# Patient Record
Sex: Male | Born: 1965 | Marital: Married | Smoking: Never smoker
Health system: Southern US, Community
[De-identification: ages and names within clinical notes are randomized; demographics above are authoritative.]

## PROBLEM LIST (undated history)

## (undated) DIAGNOSIS — I10 Essential (primary) hypertension: Secondary | ICD-10-CM

## (undated) DIAGNOSIS — L501 Idiopathic urticaria: Secondary | ICD-10-CM

## (undated) DIAGNOSIS — L309 Dermatitis, unspecified: Secondary | ICD-10-CM

## (undated) DIAGNOSIS — E7800 Pure hypercholesterolemia, unspecified: Secondary | ICD-10-CM

## (undated) DIAGNOSIS — T7840XA Allergy, unspecified, initial encounter: Secondary | ICD-10-CM

## (undated) HISTORY — DX: Idiopathic urticaria: L50.1

## (undated) HISTORY — PX: OTHER SURGICAL HISTORY: SHX169

## (undated) HISTORY — DX: Dermatitis, unspecified: L30.9

## (undated) HISTORY — DX: Allergy, unspecified, initial encounter: T78.40XA

---

## 2010-12-22 ENCOUNTER — Encounter (INDEPENDENT_AMBULATORY_CARE_PROVIDER_SITE_OTHER): Payer: BC Managed Care – PPO | Admitting: Family Medicine

## 2010-12-22 DIAGNOSIS — Z23 Encounter for immunization: Secondary | ICD-10-CM

## 2010-12-22 DIAGNOSIS — Z0000 Encounter for general adult medical examination without abnormal findings: Secondary | ICD-10-CM

## 2010-12-22 DIAGNOSIS — J019 Acute sinusitis, unspecified: Secondary | ICD-10-CM

## 2010-12-22 DIAGNOSIS — I10 Essential (primary) hypertension: Secondary | ICD-10-CM

## 2010-12-22 DIAGNOSIS — E782 Mixed hyperlipidemia: Secondary | ICD-10-CM

## 2011-04-10 ENCOUNTER — Emergency Department (HOSPITAL_COMMUNITY)
Admission: EM | Admit: 2011-04-10 | Discharge: 2011-04-10 | Disposition: A | Discharge status: Home / Self Care | Payer: BC Managed Care – PPO | Attending: Emergency Medicine | Admitting: Emergency Medicine

## 2011-04-10 ENCOUNTER — Encounter (HOSPITAL_COMMUNITY): Payer: Self-pay | Admitting: Emergency Medicine

## 2011-04-10 DIAGNOSIS — R109 Unspecified abdominal pain: Secondary | ICD-10-CM | POA: Insufficient documentation

## 2011-04-10 DIAGNOSIS — K529 Noninfective gastroenteritis and colitis, unspecified: Secondary | ICD-10-CM

## 2011-04-10 DIAGNOSIS — Z7982 Long term (current) use of aspirin: Secondary | ICD-10-CM | POA: Insufficient documentation

## 2011-04-10 DIAGNOSIS — E7800 Pure hypercholesterolemia, unspecified: Secondary | ICD-10-CM | POA: Insufficient documentation

## 2011-04-10 DIAGNOSIS — Z79899 Other long term (current) drug therapy: Secondary | ICD-10-CM | POA: Insufficient documentation

## 2011-04-10 DIAGNOSIS — I10 Essential (primary) hypertension: Secondary | ICD-10-CM | POA: Insufficient documentation

## 2011-04-10 HISTORY — DX: Essential (primary) hypertension: I10

## 2011-04-10 HISTORY — DX: Pure hypercholesterolemia, unspecified: E78.00

## 2011-04-10 MED ORDER — ONDANSETRON HCL 8 MG PO TABS: 8.0000 mg | ORAL_TABLET | ORAL | Status: AC | PRN

## 2011-04-10 MED ORDER — DIPHENOXYLATE-ATROPINE 2.5-0.025 MG PO TABS: 2.0000 | ORAL_TABLET | Freq: Four times a day (QID) | ORAL | Status: AC | PRN

## 2011-04-10 NOTE — ED Notes (Signed)
Pt presenting to ed with c/o abdominal pain with nausea, vomiting and diarrhea. Pt states he had just finished eating lunch. Pt is alert and oriented at this time.

## 2011-04-10 NOTE — Discharge Instructions (Signed)
Diet for Diarrhea, Adult Having frequent, runny stools (diarrhea) has many causes. Diarrhea may be caused or worsened by food or drink. Diarrhea may be relieved by changing your diet. IF YOU ARE NOT TOLERATING SOLID FOODS:  Drink enough water and fluids to keep your urine clear or pale yellow.   Avoid sugary drinks and sodas as well as milk-based beverages.   Avoid beverages containing caffeine and alcohol.   You may try rehydrating beverages. You can make your own by following this recipe:    tsp table salt.    tsp baking soda.   ? tsp salt substitute (potassium chloride).   1 tbs + 1 tsp sugar.   1 qt water.  As your stools become more solid, you can start eating solid foods. Add foods one at a time. If a certain food causes your diarrhea to get worse, avoid that food and try other foods. A low fiber, low-fat, and lactose-free diet is recommended. Small, frequent meals may be better tolerated.  Starches  Allowed:  White, French, and pita breads, plain rolls, buns, bagels. Plain muffins, matzo. Soda, saltine, or graham crackers. Pretzels, melba toast, zwieback. Cooked cereals made with water: cornmeal, farina, cream cereals. Dry cereals: refined corn, wheat, rice. Potatoes prepared any way without skins, refined macaroni, spaghetti, noodles, refined rice.   Avoid:  Bread, rolls, or crackers made with whole wheat, multi-grains, rye, bran seeds, nuts, or coconut. Corn tortillas or taco shells. Cereals containing whole grains, multi-grains, bran, coconut, nuts, or raisins. Cooked or dry oatmeal. Coarse wheat cereals, granola. Cereals advertised as "high-fiber." Potato skins. Whole grain pasta, wild or brown rice. Popcorn. Sweet potatoes/yams. Sweet rolls, doughnuts, waffles, pancakes, sweet breads.  Vegetables  Allowed: Strained tomato and vegetable juices. Most well-cooked and canned vegetables without seeds. Fresh: Tender lettuce, cucumber without the skin, cabbage, spinach, bean  sprouts.   Avoid: Fresh, cooked, or canned: Artichokes, baked beans, beet greens, broccoli, Brussels sprouts, corn, kale, legumes, peas, sweet potatoes. Cooked: Green or red cabbage, spinach. Avoid large servings of any vegetables, because vegetables shrink when cooked, and they contain more fiber per serving than fresh vegetables.  Fruit  Allowed: All fruit juices except prune juice. Cooked or canned: Apricots, applesauce, cantaloupe, cherries, fruit cocktail, grapefruit, grapes, kiwi, mandarin oranges, peaches, pears, plums, watermelon. Fresh: Apples without skin, ripe banana, grapes, cantaloupe, cherries, grapefruit, peaches, oranges, plums. Keep servings limited to  cup or 1 piece.   Avoid: Fresh: Apple with skin, apricots, mango, pears, raspberries, strawberries. Prune juice, stewed or dried prunes. Dried fruits, raisins, dates. Large servings of all fresh fruits.  Meat and Meat Substitutes  Allowed: Ground or well-cooked tender beef, ham, veal, lamb, pork, or poultry. Eggs, plain cheese. Fish, oysters, shrimp, lobster, other seafoods. Liver, organ meats.   Avoid: Tough, fibrous meats with gristle. Peanut butter, smooth or chunky. Cheese, nuts, seeds, legumes, dried peas, beans, lentils.  Milk  Allowed: Yogurt, lactose-free milk, kefir, drinkable yogurt, buttermilk, soy milk.   Avoid: Milk, chocolate milk, beverages made with milk, such as milk shakes.  Soups  Allowed: Bouillon, broth, or soups made from allowed foods. Any strained soup.   Avoid: Soups made from vegetables that are not allowed, cream or milk-based soups.  Desserts and Sweets  Allowed: Sugar-free gelatin, sugar-free frozen ice pops made without sugar alcohol.   Avoid: Plain cakes and cookies, pie made with allowed fruit, pudding, custard, cream pie. Gelatin, fruit, ice, sherbet, frozen ice pops. Ice cream, ice milk without nuts. Plain hard candy,   honey, jelly, molasses, syrup, sugar, chocolate syrup, gumdrops,  marshmallows.  Fats and Oils  Allowed: Avoid any fats and oils.   Avoid: Seeds, nuts, olives, avocados. Margarine, butter, cream, mayonnaise, salad oils, plain salad dressings made from allowed foods. Plain gravy, crisp bacon without rind.  Beverages  Allowed: Water, decaffeinated teas, oral rehydration solutions, sugar-free beverages.   Avoid: Fruit juices, caffeinated beverages (coffee, tea, soda or pop), alcohol, sports drinks, or lemon-lime soda or pop.  Condiments  Allowed: Ketchup, mustard, horseradish, vinegar, cream sauce, cheese sauce, cocoa powder. Spices in moderation: allspice, basil, bay leaves, celery powder or leaves, cinnamon, cumin powder, curry powder, ginger, mace, marjoram, onion or garlic powder, oregano, paprika, parsley flakes, ground pepper, rosemary, sage, savory, tarragon, thyme, turmeric.   Avoid: Coconut, honey.  Weight Monitoring: Weigh yourself every day. You should weigh yourself in the morning after you urinate and before you eat breakfast. Wear the same amount of clothing when you weigh yourself. Record your weight daily. Bring your recorded weights to your clinic visits. Tell your caregiver right away if you have gained 3 lb/1.4 kg or more in 1 day, 5 lb/2.3 kg in a week, or whatever amount you were told to report. SEEK IMMEDIATE MEDICAL CARE IF:   You are unable to keep fluids down.   You start to throw up (vomit) or diarrhea keeps coming back (persistent).   Abdominal pain develops, increases, or can be felt in one place (localizes).   You have an oral temperature above 102 F (38.9 C), not controlled by medicine.   Diarrhea contains blood or mucus.   You develop excessive weakness, dizziness, fainting, or extreme thirst.  MAKE SURE YOU:   Understand these instructions.   Will watch your condition.   Will get help right away if you are not doing well or get worse.  Document Released: 03/11/2003 Document Revised: 12/08/2010 Document Reviewed:  07/02/2008 [facility] Patient Information 2012 [place], [place].    Clear liquids for next several hours.  Medications for nausea and diarrhea.  Rest.

## 2011-04-10 NOTE — ED Provider Notes (Signed)
History     CSN: [id number]  Arrival date & time 04/10/11  1412   First MD Initiated Contact with Patient 04/10/11 1516      Chief Complaint  Patient presents with  . Abdominal Pain  . Diarrhea    (Consider location/radiation/quality/duration/timing/severity/associated sxs/prior treatment) HPI... patient is a sixth-grade Engineer, site. Location today he had abrupt onset of abdominal cramping, diarrhea, clamminess, diaphoresis.  He felt better initially and then had a second episode of same. Presently he is feeling totally normal. No exposure to ill students for family members. Symptoms have been approximately 2 hours ago scribe with mod  Past Medical History  Diagnosis Date  . Hypertension   . High cholesterol     Past Surgical History  Procedure Date  . Kidney stones     No family history on file.  History  Substance Use Topics  . Smoking status: Never Smoker   . Smokeless tobacco: Not on file  . Alcohol Use: No      Review of Systems  All other systems reviewed and are negative.    Allergies  Codeine  Home Medications   Current Outpatient Rx  Name Route Sig Dispense Refill  . ASPIRIN EC 81 MG PO TBEC Oral Take 81 mg by mouth daily.    Marland Kitchen CETIRIZINE HCL 10 MG PO TABS Oral Take 10 mg by mouth daily.    . IBUPROFEN 200 MG PO TABS Oral Take 400 mg by mouth every 6 (six) hours as needed. Pain    . LISINOPRIL 10 MG PO TABS Oral Take 10 mg by mouth daily.    . ADULT MULTIVITAMIN W/MINERALS CH Oral Take 1 tablet by mouth daily.    Marland Kitchen ROSUVASTATIN CALCIUM 10 MG PO TABS Oral Take 10 mg by mouth daily.    Marland Kitchen VITAMIN C 500 MG PO TABS Oral Take 500 mg by mouth daily.      BP 131/70  Pulse 99  Temp 97.5 F (36.4 C)  Resp 20  SpO2 94%  Physical Exam  Nursing note and vitals reviewed. Constitutional: He is oriented to person, place, and time. He appears well-developed and well-nourished.  HENT:  Head: Normocephalic and atraumatic.  Eyes: Conjunctivae and EOM  are normal. Pupils are equal, round, and reactive to light.  Neck: Normal range of motion. Neck supple.  Cardiovascular: Normal rate and regular rhythm.   Pulmonary/Chest: Effort normal and breath sounds normal.  Abdominal: Soft. Bowel sounds are normal.  Musculoskeletal: Normal range of motion.  Neurological: He is alert and oriented to person, place, and time.  Skin: Skin is warm and dry.  Psychiatric: He has a normal mood and affect.    ED Course  Procedures (including critical care time)  Labs Reviewed - No data to display No results found.   No diagnosis found.    MDM  Patient feeling much better now. He is ambulatory with normal vital signs. No testing necessary. We'll discharge home with Lomotil and Zofran.  Push fluids        Donnetta Hutching, MD 04/10/11 1556

## 2011-06-20 ENCOUNTER — Other Ambulatory Visit: Payer: Self-pay | Admitting: Family Medicine

## 2011-10-21 ENCOUNTER — Other Ambulatory Visit: Payer: Self-pay | Admitting: Family Medicine

## 2011-10-21 NOTE — Telephone Encounter (Signed)
Please pull paper chart.  

## 2011-10-21 NOTE — Telephone Encounter (Signed)
Chart pulled to PA pool at nurses station [phone]

## 2012-01-06 ENCOUNTER — Other Ambulatory Visit: Payer: Self-pay | Admitting: Family Medicine

## 2012-01-06 ENCOUNTER — Ambulatory Visit (INDEPENDENT_AMBULATORY_CARE_PROVIDER_SITE_OTHER): Payer: BC Managed Care – PPO | Admitting: Family Medicine

## 2012-01-06 VITALS — BP 141/87 | HR 80 | Temp 98.0°F | Resp 20 | Ht 69.75 in | Wt 243.0 lb

## 2012-01-06 DIAGNOSIS — E7800 Pure hypercholesterolemia, unspecified: Secondary | ICD-10-CM | POA: Insufficient documentation

## 2012-01-06 DIAGNOSIS — I10 Essential (primary) hypertension: Secondary | ICD-10-CM

## 2012-01-06 DIAGNOSIS — Z23 Encounter for immunization: Secondary | ICD-10-CM

## 2012-01-06 LAB — POCT CBC
Granulocyte percent: 58.8 %G (ref 37–80)
Hemoglobin: 16.6 g/dL (ref 14.1–18.1)
MID (cbc): 0.4 (ref 0–0.9)
MPV: 8 fL (ref 0–99.8)
POC MID %: 7.5 %M (ref 0–12)
Platelet Count, POC: 288 10*3/uL (ref 142–424)
RBC: 5.47 M/uL (ref 4.69–6.13)

## 2012-01-06 LAB — COMPREHENSIVE METABOLIC PANEL
ALT: 36 U/L (ref 0–53)
AST: 27 U/L (ref 0–37)
Albumin: 4.5 g/dL (ref 3.5–5.2)
Calcium: 9.9 mg/dL (ref 8.4–10.5)
Chloride: 103 mEq/L (ref 96–112)
Potassium: 3.9 mEq/L (ref 3.5–5.3)
Sodium: 141 mEq/L (ref 135–145)
Total Protein: 6.8 g/dL (ref 6.0–8.3)

## 2012-01-06 LAB — LIPID PANEL: Total CHOL/HDL Ratio: 4.1 Ratio

## 2012-01-06 MED ORDER — LISINOPRIL 10 MG PO TABS: 10.0000 mg | ORAL_TABLET | Freq: Every day | ORAL | Status: DC

## 2012-01-06 MED ORDER — ROSUVASTATIN CALCIUM 10 MG PO TABS: 10.0000 mg | ORAL_TABLET | Freq: Every day | ORAL | Status: DC

## 2012-01-06 NOTE — Patient Instructions (Addendum)
Please check your BP every now and then- if it is persistently running higher than 140/ 85 please let me know.  Work on losing a few pounds by cutting back on portions a little bit and getting more exercise.  I will be in touch with your labs in the next few days

## 2012-01-06 NOTE — Progress Notes (Signed)
[facility] [address] [place] [address] [phone]  Date:  01/06/2012   Name:  Kurt Rodgers   DOB:  12-Oct-1965   MRN:  [id number]  PCP:  No primary provider on file.    Chief Complaint: Medication Refill   History of Present Illness:  Kurt Rodgers is a 47 y.o. very pleasant male patient who presents with the following:  He is here today for a medication refill.  He has not eaten yet today- he is due for labs- his last labs were about one year ago. He will occasionally check his BP- it may be approx 130s over 80s.  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  He is aware that he has put on some weight over the last few years and is not exercising much.      There is no problem list on file for this patient.   Past Medical History  Diagnosis Date  . Hypertension   . High cholesterol     Past Surgical History  Procedure Date  . Kidney stones     History  Substance Use Topics  . Smoking status: Never Smoker   . Smokeless tobacco: Not on file  . Alcohol Use: No    Family History  Problem Relation Age of Onset  . Cancer Father   . Stroke Father     Allergies  Allergen Reactions  . Codeine Hives    Medication list has been reviewed and updated.  Current Outpatient Prescriptions on File Prior to Visit  Medication Sig Dispense Refill  . aspirin EC 81 MG tablet Take 81 mg by mouth daily.      . CRESTOR 10 MG tablet TAKE 1 TABLET BY MOUTH EVERY EVENING    AFTER A MEAL  30 tablet  1  . ibuprofen (ADVIL,MOTRIN) 200 MG tablet Take 400 mg by mouth every 6 (six) hours as needed. Pain      . lisinopril (PRINIVIL,ZESTRIL) 10 MG tablet Take 10 mg by mouth daily.      . Multiple Vitamin (MULITIVITAMIN WITH MINERALS) TABS Take 1 tablet by mouth daily.      . vitamin C (ASCORBIC ACID) 500 MG tablet Take 500 mg by mouth daily.      . cetirizine (ZYRTEC) 10 MG tablet Take 10 mg by mouth daily.        Review of Systems:  As per HPI-  otherwise negative.  Physical Examination: Filed Vitals:   01/06/12 1405  BP: 141/87  Pulse: 80  Temp: 98 F (36.7 C)  Resp: 20   Filed Vitals:   01/06/12 1405  Height: 5' 9.75" (1.772 m)  Weight: 243 lb (110.224 kg)   Body mass index is 35.12 kg/(m^2). Ideal Body Weight: Weight in (lb) to have BMI = 25: 172.6   GEN: WDWN, NAD, Non-toxic, A & O x 3, overweight HEENT: Atraumatic, Normocephalic. Neck supple. No masses, No LAD. Ears and Nose: No external deformity. CV: RRR, No M/G/R. No JVD. No thrill. No extra heart sounds. PULM: CTA B, no wheezes, crackles, rhonchi. No retractions. No resp. distress. No accessory muscle use. ABD: S, NT, ND EXTR: No c/c/e NEURO Normal gait.  PSYCH: Normally interactive. Conversant. Not depressed or anxious appearing.  Calm demeanor.    Assessment and Plan: 1. HTN (hypertension)  lisinopril (PRINIVIL,ZESTRIL) 10 MG tablet  2. High cholesterol  rosuvastatin (CRESTOR) 10 MG tablet, POCT CBC, Comprehensive metabolic panel, Lipid panel   Refilled medications  as above, labs pending.  Encouraged weight loss and exercise.    Abbe Amsterdam, MD

## 2012-01-09 ENCOUNTER — Encounter: Payer: Self-pay | Admitting: Family Medicine

## 2012-01-09 LAB — BILIRUBIN, FRACTIONATED(TOT/DIR/INDIR)
Bilirubin, Direct: 0.3 mg/dL (ref 0.0–0.3)
Indirect Bilirubin: 1.8 mg/dL — ABNORMAL HIGH (ref 0.0–0.9)
Total Bilirubin: 2.1 mg/dL — ABNORMAL HIGH (ref 0.3–1.2)

## 2012-01-11 ENCOUNTER — Encounter: Payer: Self-pay | Admitting: Family Medicine

## 2013-01-09 ENCOUNTER — Other Ambulatory Visit: Payer: Self-pay | Admitting: Family Medicine

## 2013-01-11 ENCOUNTER — Encounter: Payer: Self-pay | Admitting: Family Medicine

## 2013-02-01 ENCOUNTER — Ambulatory Visit (INDEPENDENT_AMBULATORY_CARE_PROVIDER_SITE_OTHER): Payer: BC Managed Care – PPO | Admitting: Internal Medicine

## 2013-02-01 VITALS — BP 126/72 | HR 74 | Temp 97.5°F | Resp 18 | Ht 70.0 in | Wt 245.0 lb

## 2013-02-01 DIAGNOSIS — Z823 Family history of stroke: Secondary | ICD-10-CM

## 2013-02-01 DIAGNOSIS — I10 Essential (primary) hypertension: Secondary | ICD-10-CM

## 2013-02-01 DIAGNOSIS — E785 Hyperlipidemia, unspecified: Secondary | ICD-10-CM

## 2013-02-01 DIAGNOSIS — Z807 Family history of other malignant neoplasms of lymphoid, hematopoietic and related tissues: Secondary | ICD-10-CM

## 2013-02-01 DIAGNOSIS — R21 Rash and other nonspecific skin eruption: Secondary | ICD-10-CM

## 2013-02-01 LAB — POCT CBC
GRANULOCYTE PERCENT: 60.3 % (ref 37–80)
HCT, POC: 47.1 % (ref 43.5–53.7)
Hemoglobin: 15.1 g/dL (ref 14.1–18.1)
Lymph, poc: 1.6 (ref 0.6–3.4)
MCH: 30.6 pg (ref 27–31.2)
MCHC: 32.1 g/dL (ref 31.8–35.4)
MCV: 95.4 fL (ref 80–97)
MID (CBC): 0.3 (ref 0–0.9)
MPV: 8.2 fL (ref 0–99.8)
PLATELET COUNT, POC: 229 10*3/uL (ref 142–424)
POC Granulocyte: 2.9 (ref 2–6.9)
POC LYMPH %: 32.8 % (ref 10–50)
POC MID %: 6.9 % (ref 0–12)
RBC: 4.94 M/uL (ref 4.69–6.13)
RDW, POC: 14.4 %
WBC: 4.8 10*3/uL (ref 4.6–10.2)

## 2013-02-01 LAB — LIPID PANEL
CHOL/HDL RATIO: 4 ratio
CHOLESTEROL: 157 mg/dL (ref 0–200)
HDL: 39 mg/dL — ABNORMAL LOW (ref >39)
LDL CALC: 75 mg/dL (ref 0–99)
Triglycerides: 214 mg/dL — ABNORMAL HIGH (ref <150)
VLDL: 43 mg/dL — AB (ref 0–40)

## 2013-02-01 LAB — POCT URINALYSIS DIPSTICK
Bilirubin, UA: NEGATIVE
Blood, UA: NEGATIVE
GLUCOSE UA: NEGATIVE
Ketones, UA: NEGATIVE
LEUKOCYTES UA: NEGATIVE
NITRITE UA: NEGATIVE
PROTEIN UA: NEGATIVE
SPEC GRAV UA: 1.02
UROBILINOGEN UA: 0.2
pH, UA: 5.5

## 2013-02-01 LAB — POCT UA - MICROSCOPIC ONLY
BACTERIA, U MICROSCOPIC: NEGATIVE
CASTS, UR, LPF, POC: NEGATIVE
CRYSTALS, UR, HPF, POC: NEGATIVE
Mucus, UA: NEGATIVE
Yeast, UA: NEGATIVE

## 2013-02-01 LAB — COMPREHENSIVE METABOLIC PANEL
ALBUMIN: 4.5 g/dL (ref 3.5–5.2)
ALK PHOS: 49 U/L (ref 39–117)
ALT: 36 U/L (ref 0–53)
AST: 27 U/L (ref 0–37)
BILIRUBIN TOTAL: 2.1 mg/dL — AB (ref 0.2–1.2)
BUN: 13 mg/dL (ref 6–23)
CO2: 22 mEq/L (ref 19–32)
Calcium: 9.2 mg/dL (ref 8.4–10.5)
Chloride: 105 mEq/L (ref 96–112)
Creat: 0.84 mg/dL (ref 0.50–1.35)
Glucose, Bld: 84 mg/dL (ref 70–99)
POTASSIUM: 4.1 meq/L (ref 3.5–5.3)
SODIUM: 140 meq/L (ref 135–145)
TOTAL PROTEIN: 6.7 g/dL (ref 6.0–8.3)

## 2013-02-01 MED ORDER — ROSUVASTATIN CALCIUM 10 MG PO TABS: 10.0000 mg | ORAL_TABLET | Freq: Every day | ORAL | Status: DC

## 2013-02-01 MED ORDER — LISINOPRIL 10 MG PO TABS: 10.0000 mg | ORAL_TABLET | Freq: Every day | ORAL | Status: DC

## 2013-02-01 MED ORDER — TRIAMCINOLONE 0.1 % CREAM:EUCERIN CREAM 1:1: 1.0000 "application " | TOPICAL_CREAM | Freq: Two times a day (BID) | CUTANEOUS | Status: DC

## 2013-02-01 NOTE — Progress Notes (Signed)
   Subjective:    Patient ID: Kurt Rodgers, male    DOB: 07/17/1965, 48 y.o.   MRN: [id number]  HPI Needs meds rfed for HTN and hyperlipidemia. Also has a chronic skin rash  On ankles left more than right. No problems with meds. Last cpe over 1 year ago.   Review of Systems No problems    Objective:   Physical Exam  Constitutional: He is oriented to person, place, and time. He appears well-developed and well-nourished.  HENT:  Head: Normocephalic.  Eyes: EOM are normal. No scleral icterus.  Neck: Normal range of motion.  Cardiovascular: Normal rate, regular rhythm and normal heart sounds.   Pulmonary/Chest: Effort normal and breath sounds normal.  Musculoskeletal: Normal range of motion.  Neurological: He is alert and oriented to person, place, and time. He exhibits normal muscle tone. Coordination normal.  Skin: Skin is dry and intact. Rash noted. No abrasion, no ecchymosis and no lesion noted. Rash is macular. There is erythema.     Psychiatric: He has a normal mood and affect. His behavior is normal. Thought content normal.   Results for orders placed in visit on 02/01/13  POCT CBC      Result Value Range   WBC 4.8  4.6 - 10.2 K/uL   Lymph, poc 1.6  0.6 - 3.4   POC LYMPH PERCENT 32.8  10 - 50 %L   MID (cbc) 0.3  0 - 0.9   POC MID % 6.9  0 - 12 %M   POC Granulocyte 2.9  2 - 6.9   Granulocyte percent 60.3  37 - 80 %G   RBC 4.94  4.69 - 6.13 M/uL   Hemoglobin 15.1  14.1 - 18.1 g/dL   HCT, POC 78.247.1  95.643.5 - 53.7 %   MCV 95.4  80 - 97 fL   MCH, POC 30.6  27 - 31.2 pg   MCHC 32.1  31.8 - 35.4 g/dL   RDW, POC 21.314.4     Platelet Count, POC 229  142 - 424 K/uL   MPV 8.2  0 - 99.8 fL  POCT UA - MICROSCOPIC ONLY      Result Value Range   WBC, Ur, HPF, POC 0-1     RBC, urine, microscopic 0-1     Bacteria, U Microscopic NEG     Mucus, UA NEG     Epithelial cells, urine per micros 0-1     Crystals, Ur, HPF, POC NEG     Casts, Ur, LPF, POC NEG     Yeast, UA NEG    POCT  URINALYSIS DIPSTICK      Result Value Range   Color, UA YELLOW     Clarity, UA CLEAR     Glucose, UA NEG     Bilirubin, UA NEG     Ketones, UA NEG     Spec Grav, UA 1.020     Blood, UA NEG     pH, UA 5.5     Protein, UA NEG     Urobilinogen, UA 0.2     Nitrite, UA NEG     Leukocytes, UA Negative            Assessment & Plan:  HTN/Lipids/Stasis dermatitis Support stockings/Cream

## 2013-02-01 NOTE — Patient Instructions (Signed)
Venous Stasis or Chronic Venous Insufficiency Chronic venous insufficiency, also called venous stasis, is a condition that affects the veins in the legs. The condition prevents blood from being pumped through these veins effectively. Blood may no longer be pumped effectively from the legs back to the heart. This condition can range from mild to severe. With proper treatment, you should be able to continue with an active life. CAUSES  Chronic venous insufficiency occurs when the vein walls become stretched, weakened, or damaged or when valves within the vein are damaged. Some common causes of this include:  High blood pressure inside the veins (venous hypertension).  Increased blood pressure in the leg veins from long periods of sitting or standing.  A blood clot that blocks blood flow in a vein (deep vein thrombosis).  Inflammation of a superficial vein (phlebitis) that causes a blood clot to form. RISK FACTORS Various things can make you more likely to develop chronic venous insufficiency, including:  Family history of this condition.  Obesity.  Pregnancy.  Sedentary lifestyle.  Smoking.  Jobs requiring long periods of standing or sitting in one place.  Being a certain age. Women in their 40s and 50s and men in their 70s are more likely to develop this condition. SIGNS AND SYMPTOMS  Symptoms may include:   Varicose veins.  Skin breakdown or ulcers.  Reddened or discolored skin on the leg.  Brown, smooth, tight, and painful skin just above the ankle, usually on the inside surface (lipodermatosclerosis).  Swelling. DIAGNOSIS  To diagnose this condition, your health care provider will take a medical history and do a physical exam. The following tests may be ordered to confirm the diagnosis:  Duplex ultrasound A procedure that produces a picture of a blood vessel and nearby organs and also provides information on blood flow through the blood vessel.  Plethysmography A  procedure that tests blood flow.  A venogram, or venography A procedure used to look at the veins using X-ray and dye. TREATMENT The goals of treatment are to help you return to an active life and to minimize pain or disability. Treatment will depend on the severity of the condition. Medical procedures may be needed for severe cases. Treatment options may include:   Use of compression stockings. These can help with symptoms and lower the chances of the problem getting worse, but they do not cure the problem.  Sclerotherapy A procedure involving an injection of a material that "dissolves" the damaged veins. Other veins in the network of blood vessels take over the function of the damaged veins.  Surgery to remove the vein or cut off blood flow through the vein (vein stripping or laser ablation surgery).  Surgery to repair a valve. HOME CARE INSTRUCTIONS   Wear compression stockings as directed by your health care provider.  Only take over-the-counter or prescription medicines for pain, discomfort, or fever as directed by your health care provider.  Follow up with your health care provider as directed. SEEK MEDICAL CARE IF:   You have redness, swelling, or increasing pain in the affected area.  You see a red streak or line that extends up or down from the affected area.  You have a breakdown or loss of skin in the affected area, even if the breakdown is small.  You have an injury to the affected area. SEEK IMMEDIATE MEDICAL CARE IF:   You have an injury and open wound in the affected area.  Your pain is severe and does not improve with   medicine.  You have sudden numbness or weakness in the foot or ankle below the affected area, or you have trouble moving your foot or ankle.  You have a fever or persistent symptoms for more than 2 3 days.  You have a fever and your symptoms suddenly get worse. MAKE SURE YOU:   Understand these instructions.  Will watch your condition.  Will  get help right away if you are not doing well or get worse. Document Released: 04/24/2006 Document Revised: 10/09/2012 Document Reviewed: 08/26/2012 [employer] Patient Information 2014 [employer].  

## 2013-02-02 LAB — TSH: TSH: 0.781 u[IU]/mL (ref 0.350–4.500)

## 2013-02-02 LAB — PSA: PSA: 1.32 ng/mL (ref <=4.00)

## 2013-08-01 ENCOUNTER — Ambulatory Visit (INDEPENDENT_AMBULATORY_CARE_PROVIDER_SITE_OTHER): Payer: BC Managed Care – PPO | Admitting: Family Medicine

## 2013-08-01 ENCOUNTER — Encounter: Payer: Self-pay | Admitting: Family Medicine

## 2013-08-01 VITALS — BP 104/76 | HR 87 | Temp 97.9°F | Resp 16 | Ht 69.75 in | Wt 243.4 lb

## 2013-08-01 DIAGNOSIS — E7800 Pure hypercholesterolemia, unspecified: Secondary | ICD-10-CM

## 2013-08-01 DIAGNOSIS — E669 Obesity, unspecified: Secondary | ICD-10-CM

## 2013-08-01 DIAGNOSIS — I10 Essential (primary) hypertension: Secondary | ICD-10-CM

## 2013-08-01 LAB — COMPLETE METABOLIC PANEL WITH GFR
ALK PHOS: 55 U/L (ref 39–117)
ALT: 21 U/L (ref 0–53)
AST: 18 U/L (ref 0–37)
Albumin: 4.4 g/dL (ref 3.5–5.2)
BILIRUBIN TOTAL: 2.3 mg/dL — AB (ref 0.2–1.2)
BUN: 14 mg/dL (ref 6–23)
CO2: 23 mEq/L (ref 19–32)
Calcium: 9.1 mg/dL (ref 8.4–10.5)
Chloride: 102 mEq/L (ref 96–112)
Creat: 0.86 mg/dL (ref 0.50–1.35)
GFR, Est African American: >89 mL/min
GLUCOSE: 75 mg/dL (ref 70–99)
Potassium: 4.2 mEq/L (ref 3.5–5.3)
SODIUM: 138 meq/L (ref 135–145)
TOTAL PROTEIN: 6.9 g/dL (ref 6.0–8.3)

## 2013-08-01 LAB — THYROID PANEL WITH TSH
Free Thyroxine Index: 3.7 (ref 1.0–3.9)
T3 UPTAKE: 36.9 % (ref 22.5–37.0)
T4 TOTAL: 10.1 ug/dL (ref 5.0–12.5)
TSH: 1.53 u[IU]/mL (ref 0.350–4.500)

## 2013-08-01 LAB — LIPID PANEL
CHOL/HDL RATIO: 3.5 ratio
CHOLESTEROL: 157 mg/dL (ref 0–200)
HDL: 45 mg/dL (ref >39)
LDL CALC: 86 mg/dL (ref 0–99)
Triglycerides: 132 mg/dL (ref <150)
VLDL: 26 mg/dL (ref 0–40)

## 2013-08-01 NOTE — Patient Instructions (Signed)
Hypertension- you can try taking 1/2 Lisinopril tablet daily and check your BP; if the top number stays below 140 and bottom number stays below 90, continue this dose. If the number goes above 140/90, then resume whole 10 mg tablet daily.  Below is some information about "heart healthy" nutrition:      Mediterranean Diet  Why follow it? Research shows.   Those who follow the Mediterranean diet have a reduced risk of heart disease    The diet is associated with a reduced incidence of Parkinson's and Alzheimer's diseases   People following the diet may have longer life expectancies and lower rates of chronic diseases    The Dietary Guidelines for Americans recommends the Mediterranean diet as an eating plan to promote health and prevent disease  What Is the Mediterranean Diet?    Healthy eating plan based on typical foods and recipes of Mediterranean-style cooking   The diet is primarily a plant based diet; these foods should make up a majority of meals   Starches - Plant based foods should make up a majority of meals - They are an important sources of vitamins, minerals, energy, antioxidants, and fiber - Choose whole grains, foods high in fiber and minimally processed items  - Typical grain sources include wheat, oats, barley, corn, brown rice, bulgar, farro, millet, polenta, couscous  - Various types of beans include chickpeas, lentils, fava beans, black beans, white beans   Fruits  Veggies - Large quantities of antioxidant rich fruits & veggies; 6 or more servings  - Vegetables can be eaten raw or lightly drizzled with oil and cooked  - Vegetables common to the traditional Mediterranean Diet include: artichokes, arugula, beets, broccoli, brussel sprouts, cabbage, carrots, celery, collard greens, cucumbers, eggplant, kale, leeks, lemons, lettuce, mushrooms, okra, onions, peas, peppers, potatoes, pumpkin, radishes, rutabaga, shallots, spinach, sweet potatoes, turnips, zucchini - Fruits common  to the Mediterranean Diet include: apples, apricots, avocados, cherries, clementines, dates, figs, grapefruits, grapes, melons, nectarines, oranges, peaches, pears, pomegranates, strawberries, tangerines  Fats - Replace butter and margarine with healthy oils, such as olive oil, canola oil, and tahini  - Limit nuts to no more than a handful a day  - Nuts include walnuts, almonds, pecans, pistachios, pine nuts  - Limit or avoid candied, honey roasted or heavily salted nuts - Olives are central to the [employer]iterranean diet - can be eaten whole or used in a variety of dishes   Meats Protein - Limiting red meat: no more than a few times a month - When eating red meat: choose lean cuts and keep the portion to the size of deck of cards - Eggs: approx. 0 to 4 times a week  - Fish and lean poultry: at least 2 a week  - Healthy protein sources include, chicken, [place], lean beef, lamb - Increase intake of seafood such as tuna, salmon, trout, mackerel, shrimp, scallops - Avoid or limit high fat processed meats such as sausage and bacon  Dairy - Include moderate amounts of low fat dairy products  - Focus on healthy dairy such as fat free yogurt, skim milk, low or reduced fat cheese - Limit dairy products higher in fat such as whole or 2% milk, cheese, ice cream  Alcohol - Moderate amounts of red wine is ok  - No more than 5 oz daily for women (all ages) and men older than age 46  - No more than 10 oz of wine daily for men younger than 7265  Other -  Limit sweets and other desserts  - Use herbs and spices instead of salt to flavor foods  - Herbs and spices common to the traditional Mediterranean Diet include: basil, bay leaves, chives, cloves, cumin, fennel, garlic, lavender, marjoram, mint, oregano, parsley, pepper, rosemary, sage, savory, sumac, tarragon, thyme   It's not just a diet, it's a lifestyle:    The Mediterranean diet includes lifestyle factors typical of those in the region    Foods, drinks and  meals are best eaten with others and savored   Daily physical activity is important for overall good health   This could be strenuous exercise like running and aerobics   This could also be more leisurely activities such as walking, housework, yard-work, or taking the stairs   Moderation is the key; a balanced and healthy diet accommodates most foods and drinks   Consider portion sizes and frequency of consumption of certain foods   Meal Ideas & Options:    Breakfast:  o Whole wheat toast or whole wheat English muffins with peanut butter & hard boiled egg o Steel cut oats topped with apples & cinnamon and skim milk  o Fresh fruit: banana, strawberries, melon, berries, peaches  o Smoothies: strawberries, bananas, greek yogurt, peanut butter o Low fat greek yogurt with blueberries and granola  o Egg white omelet with spinach and mushrooms o Breakfast couscous: whole wheat couscous, apricots, skim milk, cranberries    Sandwiches:  o Hummus and grilled vegetables (peppers, zucchini, squash) on whole wheat bread   o Grilled chicken on whole wheat pita with lettuce, tomatoes, cucumbers or tzatziki  o Tuna salad on whole wheat bread: tuna salad made with greek yogurt, olives, red peppers, capers, green onions o Garlic rosemary lamb pita: lamb sauted with garlic, rosemary, salt & pepper; add lettuce, cucumber, greek yogurt to pita - flavor with lemon juice and black pepper    Seafood:  o Mediterranean grilled salmon, seasoned with garlic, basil, parsley, lemon juice and black pepper o Shrimp, lemon, and spinach whole-grain pasta salad made with low fat greek yogurt  o Seared scallops with lemon orzo  o Seared tuna steaks seasoned salt, pepper, coriander topped with tomato mixture of olives, tomatoes, olive oil, minced garlic, parsley, green onions and cappers    Meats:  o Herbed greek chicken salad with kalamata olives, cucumber, feta  o Red bell peppers stuffed with spinach, bulgur, lean ground  beef (or lentils) & topped with feta   o Kebabs: skewers of chicken, tomatoes, onions, zucchini, squash  o [place] burgers: made with red onions, mint, dill, lemon juice, feta cheese topped with roasted red peppers   Vegetarian o Cucumber salad: cucumbers, artichoke hearts, celery, red onion, feta cheese, tossed in olive oil & lemon juice  o Hummus and whole grain pita points with a greek salad (lettuce, tomato, feta, olives, cucumbers, red onion) o Lentil soup with celery, carrots made with vegetable broth, garlic, salt and pepper  o Tabouli salad: parsley, bulgur, mint, scallions, cucumbers, tomato, radishes, lemon juice, olive oil, salt and pepper. o

## 2013-08-01 NOTE — Progress Notes (Signed)
S:  This 48 y.o. Cauc male has well controlled HTN; BP readings at home 110-130/ 70-85. Today's reading is lowest ever; pt asymptomatic. He is compliant w/ medication w/o adverse reactions. Pt denies fatigue, diaphoresis, vision disturbances, CP or tightness, palpitations, edema, SOB or cough, HA, dizziness, numbness, weakness or syncope. No abd pain, GI upset, back pain or myalgias related to statin.  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 In his free time, he enjoys reading and quite activities at home.  Patient Active Problem List   Diagnosis Date Noted  . HTN (hypertension) 01/06/2012  . High cholesterol 01/06/2012   Outpatient Encounter Prescriptions as of 08/01/2013  Medication Sig Note  . aspirin EC 81 MG tablet Take 81 mg by mouth daily.   . cetirizine (ZYRTEC) 10 MG tablet Take 10 mg by mouth daily.   Marland Kitchen. ibuprofen (ADVIL,MOTRIN) 200 MG tablet Take 400 mg by mouth every 6 (six) hours as needed. Pain   . lisinopril (PRINIVIL,ZESTRIL) 10 MG tablet Take 1 tablet (10 mg total) by mouth daily.   . Multiple Vitamin (MULITIVITAMIN WITH MINERALS) TABS Take 1 tablet by mouth daily.   . rosuvastatin (CRESTOR) 10 MG tablet Take 1 tablet (10 mg total) by mouth daily.   . Triamcinolone Acetonide (TRIAMCINOLONE 0.1 % CREAM : EUCERIN) CREA Apply 1 application topically 2 (two) times daily.   . vitamin C (ASCORBIC ACID) 500 MG tablet Take 500 mg by mouth daily.     Allergies  Allergen Reactions  . Codeine Hives   History   Social History  . Marital Status: Married    Spouse Name: N/A    Number of Children: N/A  . Years of Education: N/A   Occupational History  . Teacher   Social History Main Topics  . Smoking status: Never Smoker   . Smokeless tobacco: Not on file  . Alcohol Use: No  . Drug Use: No  . Sexual Activity: Not on file    O: Filed Vitals:   08/01/13 0801  BP: 104/76  Pulse: 87  Temp: 97.9 F (36.6 C)  Resp: 16   GEN: In NAD: WN,WD. HENT: [place]/AT;  EOMI w/ clear conj/sclerae. Ext ears/ nose/oroph unremarkable. COR: RRR.  LUNGS: Normal resp effort. SKIN: W&D; intact w/o diaphoresis, erythema or pallor. MS: MAEs; no deformities or muscle atrophy. NEURO: A&Ox 3; CNs intact. Nonfocal.  A/P: Essential hypertension - Plan: Thyroid Panel With TSH, COMPLETE METABOLIC PANEL WITH GFR  High cholesterol - Stable on Crestor; no dose change.  Plan: Thyroid Panel With TSH, Lipid panel  Obesity, unspecified - Encouraged more active lifestyle. Plan: Vitamin D, 25-hydroxy

## 2013-08-02 LAB — VITAMIN D 25 HYDROXY (VIT D DEFICIENCY, FRACTURES): Vit D, 25-Hydroxy: 45 ng/mL (ref 30–89)

## 2013-08-03 ENCOUNTER — Other Ambulatory Visit: Payer: Self-pay | Admitting: Family Medicine

## 2013-08-03 DIAGNOSIS — R17 Unspecified jaundice: Secondary | ICD-10-CM

## 2013-08-03 NOTE — Progress Notes (Signed)
Quick Note:  Please advise pt regarding following labs... All your labs are normal except the Total bilirubin remains above normal. I would like to get an ultrasound of your liver and gallbladder to see if there is any abnormality to explain this persistent elevation. 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 I will order the test and a staff person will call you with the details of that appointment. If you do not want to have the ultrasound, let the staff person know and they will cancel it.  Copy to pt.  ______

## 2013-08-06 ENCOUNTER — Encounter: Payer: Self-pay | Admitting: Radiology

## 2013-08-07 ENCOUNTER — Ambulatory Visit
Admission: RE | Admit: 2013-08-07 | Discharge: 2013-08-07 | Disposition: A | Discharge status: Home / Self Care | Payer: BC Managed Care – PPO | Source: Ambulatory Visit | Attending: Family Medicine | Admitting: Family Medicine

## 2013-08-07 DIAGNOSIS — R17 Unspecified jaundice: Secondary | ICD-10-CM

## 2013-08-07 NOTE — Progress Notes (Signed)
Quick Note:  Your recent imaging study (ultrasound of liver and gallbladder) shows fatty infiltration of the liver. This is seen with overweight persons who have increased abdominal wall fatty tissue.  No other problems identified. ______

## 2013-12-24 ENCOUNTER — Ambulatory Visit (INDEPENDENT_AMBULATORY_CARE_PROVIDER_SITE_OTHER): Payer: BC Managed Care – PPO | Admitting: Family Medicine

## 2013-12-24 ENCOUNTER — Encounter: Payer: Self-pay | Admitting: Family Medicine

## 2013-12-24 VITALS — BP 138/86 | HR 98 | Temp 97.9°F | Resp 16 | Ht 70.0 in | Wt 243.8 lb

## 2013-12-24 DIAGNOSIS — K760 Fatty (change of) liver, not elsewhere classified: Secondary | ICD-10-CM

## 2013-12-24 DIAGNOSIS — E669 Obesity, unspecified: Secondary | ICD-10-CM

## 2013-12-24 DIAGNOSIS — I10 Essential (primary) hypertension: Secondary | ICD-10-CM

## 2013-12-24 DIAGNOSIS — Z23 Encounter for immunization: Secondary | ICD-10-CM

## 2013-12-24 DIAGNOSIS — M7502 Adhesive capsulitis of left shoulder: Secondary | ICD-10-CM

## 2013-12-24 DIAGNOSIS — Z5181 Encounter for therapeutic drug level monitoring: Secondary | ICD-10-CM

## 2013-12-24 DIAGNOSIS — E785 Hyperlipidemia, unspecified: Secondary | ICD-10-CM

## 2013-12-24 DIAGNOSIS — Z0000 Encounter for general adult medical examination without abnormal findings: Secondary | ICD-10-CM

## 2013-12-24 LAB — BASIC METABOLIC PANEL
BUN: 14 mg/dL (ref 6–23)
CHLORIDE: 104 meq/L (ref 96–112)
CO2: 24 meq/L (ref 19–32)
CREATININE: 0.84 mg/dL (ref 0.50–1.35)
Calcium: 9.2 mg/dL (ref 8.4–10.5)
GLUCOSE: 88 mg/dL (ref 70–99)
Potassium: 4.3 mEq/L (ref 3.5–5.3)
SODIUM: 140 meq/L (ref 135–145)

## 2013-12-24 MED ORDER — ROSUVASTATIN CALCIUM 10 MG PO TABS: 10.0000 mg | ORAL_TABLET | Freq: Every day | ORAL | Status: DC

## 2013-12-24 MED ORDER — LISINOPRIL 10 MG PO TABS: 10.0000 mg | ORAL_TABLET | Freq: Every day | ORAL | Status: DC

## 2013-12-24 NOTE — Progress Notes (Signed)
Subjective:    Patient ID: Kurt Rodgers, male    DOB: 01/17/1965, 48 y.o.   MRN: [id number]  PCP: No primary care provider on file.  Chief Complaint  Patient presents with  . Annual Exam   Patient Active Problem List   Diagnosis Date Noted  . HTN (hypertension) 01/06/2012  . High cholesterol 01/06/2012   Prior to Admission medications   Medication Sig Start Date End Date Taking? Authorizing Provider  aspirin EC 81 MG tablet Take 81 mg by mouth daily.   Yes Historical Provider, MD  cetirizine (ZYRTEC) 10 MG tablet Take 10 mg by mouth daily.   Yes Historical Provider, MD  ibuprofen (ADVIL,MOTRIN) 200 MG tablet Take 400 mg by mouth every 6 (six) hours as needed. Pain   Yes Historical Provider, MD  lisinopril (PRINIVIL,ZESTRIL) 10 MG tablet Take 1 tablet (10 mg total) by mouth daily. 02/01/13  Yes Jonita Albeehris W Guest, MD  Multiple Vitamin (MULITIVITAMIN WITH MINERALS) TABS Take 1 tablet by mouth daily.   Yes Historical Provider, MD  rosuvastatin (CRESTOR) 10 MG tablet Take 1 tablet (10 mg total) by mouth daily. 02/01/13  Yes Jonita Albeehris W Guest, MD  Triamcinolone Acetonide (TRIAMCINOLONE 0.1 % CREAM : EUCERIN) CREA Apply 1 application topically 2 (two) times daily. 02/01/13  Yes Jonita Albeehris W Guest, MD  vitamin C (ASCORBIC ACID) 500 MG tablet Take 500 mg by mouth daily.   Yes Historical Provider, MD   Medications, allergies, past medical history, surgical history, family history, social history and problem list reviewed and updated.  HPI  [age] yom with pmh htn, high cholesterol presents for cpe.  Has been doing well since last visit in July. At that visit his bp was well controlled on 10 mg lisinopril. It was noted he could cut dose in 1/2 if he wanted. Today he states he has not really been checking his bp at home other than when he is not feeling well. It runs 130s/80s at home and is in that range today. He denies any cp, sob, vision changes, ha, dizziness, syncope.   Has had number of labs drawn within  past year which have all been wnl other than bili which has been elevated. RUQ US done 8/15 showed hepatic steatosis.   Has hx adhesive capsulitis left shoulder, went through PT over the summer and has been much improved. Mentions he has normal ROM and no real pain throughout the day other than when he lies on the shoulder at night.   He had uri couple wks ago which has resolved.   Exercise: Not doing any. He does pt/stretching for left shoulder but otherwise no exercise or aerobic activity. Diet: Not really watching his diet. Drinks occasional sugary drinks.   Review of Systems No chest pain, SOB, HA, dizziness, vision change, N/V, diarrhea, constipation, dysuria, urinary urgency or frequency, myalgias, arthralgias or rash.    Objective:   Physical Exam  Constitutional: He is oriented to person, place, and time. He appears well-developed and well-nourished.  Non-toxic appearance. He does not have a sickly appearance. He does not appear ill. No distress.  BP 138/86 mmHg  Pulse 98  Temp(Src) 97.9 F (36.6 C) (Oral)  Resp 16  Ht 5\' 10"  (1.778 m)  Wt 243 lb 12.8 oz (110.587 kg)  BMI 34.98 kg/m2  SpO2 93%   HENT:  Right Ear: Tympanic membrane normal.  Left Ear: Tympanic membrane normal.  Nose: Nose normal.  Mouth/Throat: Uvula is midline, oropharynx is clear and moist and mucous  membranes are normal. No oropharyngeal exudate, posterior oropharyngeal edema, posterior oropharyngeal erythema or tonsillar abscesses.  Eyes: Conjunctivae and EOM are normal. Pupils are equal, round, and reactive to light.  Neck: Normal range of motion.  Cardiovascular: Normal rate, regular rhythm, S1 normal, S2 normal and normal heart sounds.  Exam reveals no gallop.   No murmur heard. Pulses:      Dorsalis pedis pulses are 2+ on the right side, and 2+ on the left side.  Pulmonary/Chest: Effort normal and breath sounds normal. He has no decreased breath sounds. He has no wheezes. He has no rhonchi. He has no  rales.  Abdominal: Soft. Normal appearance and bowel sounds are normal. There is no hepatomegaly. There is no tenderness. There is negative Murphy's sign.  Musculoskeletal:       Left shoulder: Normal. He exhibits normal range of motion, no tenderness, no bony tenderness and no crepitus.  Neurological: He is alert and oriented to person, place, and time. He has normal strength. No cranial nerve deficit or sensory deficit.  Psychiatric: He has a normal mood and affect. His speech is normal.      Assessment & Plan:   [age] yom with pmh htn, high cholesterol presents for cpe.  Annual physical exam --normal vitals and exam today, no monitoring labs due today as all have been drawn within prev year --rtc one year --pt declines std testing  Need for Tdap vaccination - Plan: Tdap vaccine greater than or equal to 7yo IM  Encounter for therapeutic drug monitoring - Plan: Basic metabolic panel --lisinopril monitoring  Essential hypertension - Plan: lisinopril (PRINIVIL,ZESTRIL) 10 MG tablet --130s/80s range at home and in clinic today --refill lisinopril  Adhesive capsulitis, left --PT this past summer, much improved --nearly full rom, only painful if lies on it at night --continue exercises at home  Hepatic steatosis Hyperlipidemia - Plan: rosuvastatin (CRESTOR) 10 MG tablet --lipids normal 7/15 --refill crestor --encourage wt loss, exercise, limiting fat/sweets intake  Donnajean Lopesodd M. Mohsin Crum, PA-C Physician Assistant-Certified [facility]  12/24/2013 9:04 AM

## 2013-12-24 NOTE — Progress Notes (Signed)
   Subjective:    Patient ID: Kurt Rodgers, male    DOB: 06/04/1965, 48 y.o.   MRN: [id number]  HPI  This 48 y.o. Cauc male is here for CPE. He has well controlled HTN, lipid disorder and obesity. No lifestyle changes for weight loss.  See documentation provided by Raelyn Ensignodd McVeigh, PA-C  Patient Active Problem List   Diagnosis Date Noted  . Obesity 12/24/2013  . HTN (hypertension) 01/06/2012  . High cholesterol 01/06/2012    Prior to Admission medications   Medication Sig Start Date End Date Taking? Authorizing Provider  aspirin EC 81 MG tablet Take 81 mg by mouth daily.   Yes Historical Provider, MD  cetirizine (ZYRTEC) 10 MG tablet Take 10 mg by mouth daily.   Yes Historical Provider, MD  ibuprofen (ADVIL,MOTRIN) 200 MG tablet Take 400 mg by mouth every 6 (six) hours as needed. Pain   Yes Historical Provider, MD  lisinopril (PRINIVIL,ZESTRIL) 10 MG tablet Take 1 tablet (10 mg total) by mouth daily.   Yes   Multiple Vitamin (MULITIVITAMIN WITH MINERALS) TABS Take 1 tablet by mouth daily.   Yes Historical Provider, MD  rosuvastatin (CRESTOR) 10 MG tablet Take 1 tablet (10 mg total) by mouth daily.   Yes   Triamcinolone Acetonide (TRIAMCINOLONE 0.1 % CREAM : EUCERIN) CREA Apply 1 application topically 2 (two) times daily.   Yes Jonita Albeehris W Guest, MD  vitamin C (ASCORBIC ACID) 500 MG tablet Take 500 mg by mouth daily.   Yes Historical Provider, MD    PMHx, SURG Hx, SOC and FAM Hx reviewed.   Review of Systems  Constitutional: Negative.   HENT: Negative.   Eyes: Negative.   Respiratory: Positive for cough.   Cardiovascular: Negative.   Gastrointestinal: Negative.   Endocrine: Negative.   Genitourinary: Negative.   Musculoskeletal: Positive for arthralgias.  Skin: Negative.   Allergic/Immunologic: Negative.   Neurological: Negative.   Hematological: Negative.   Psychiatric/Behavioral: Negative.       Objective:   Physical Exam See documentation by McVeigh, PA-C- nothing to  add.     Assessment & Plan:  Annual physical exam  Encounter for therapeutic drug monitoring - Plan: Basic metabolic panel  Essential hypertension - Plan: lisinopril (PRINIVIL,ZESTRIL) 10 MG tablet  Adhesive capsulitis, left  Hyperlipidemia - Plan: rosuvastatin (CRESTOR) 10 MG tablet  Hepatic steatosis- Related to obesity.  Obesity- Encouraged regular exercise for improving physical fitness and preventing cognitive decline.  Need for Tdap vaccination - Plan: Tdap vaccine greater than or equal to 7yo IM   I have reviewed and agree with documentation per Raelyn Ensignodd McVeigh, PA-C.   Maurice MarchBarbara B. Ludie Hudon, MD [facility]

## 2013-12-24 NOTE — Patient Instructions (Signed)
Keeping you healthy  Get these tests  Blood pressure- Have your blood pressure checked once a year by your healthcare provider.  Normal blood pressure is 120/80.  Weight- Have your body mass index (BMI) calculated to screen for obesity.  BMI is a measure of body fat based on height and weight. You can also calculate your own BMI at www.nhlbisupport.com/bmi/.  Cholesterol- Have your cholesterol checked regularly starting at age 46, sooner may be necessary if you have diabetes, high blood pressure, if a family member developed heart diseases at an early age or if you smoke.   Chlamydia, HIV, and other sexual transmitted disease- Get screened each year until the age of 25 then within three months of each new sexual partner.  Diabetes- Have your blood sugar checked regularly if you have high blood pressure, high cholesterol, a family history of diabetes or if you are overweight.  Get these vaccines  Flu shot- Every fall.  Tetanus shot- Every 10 years.  Menactra- Single dose; prevents meningitis.  Take these steps  Don't smoke- If you do smoke, ask your healthcare provider about quitting. For tips on how to quit, go to www.smokefree.gov or call 1-800-QUIT-NOW.  Be physically active- Exercise 5 days a week for at least 30 minutes.  If you are not already physically active start slow and gradually work up to 30 minutes of moderate physical activity.  Examples of moderate activity include walking briskly, mowing the yard, dancing, swimming bicycling, etc.  Eat a healthy diet- Eat a variety of healthy foods such as fruits, vegetables, low fat milk, low fat cheese, yogurt, lean meats, poultry, fish, beans, tofu, etc.  For more information on healthy eating, go to www.thenutritionsource.org  Drink alcohol in moderation- Limit alcohol intake two drinks or less a day.  Never drink and drive.  Dentist- Brush and floss teeth twice daily; visit your dentis twice a year.  Depression-Your emotional  health is as important as your physical health.  If you're feeling down, losing interest in things you normally enjoy please talk with your healthcare provider.  Gun Safety- If you keep a gun in your home, keep it unloaded and with the safety lock on.  Bullets should be stored separately.  Helmet use- Always wear a helmet when riding a motorcycle, bicycle, rollerblading or skateboarding.  Safe sex- If you may be exposed to a sexually transmitted infection, use a condom  Seat belts- Seat bels can save your life; always wear one.  Smoke/Carbon Monoxide detectors- These detectors need to be installed on the appropriate level of your home.  Replace batteries at least once a year.  Skin Cancer- When out in the sun, cover up and use sunscreen SPF 15 or higher.  Violence- If anyone is threatening or hurting you, please tell your healthcare provider.    DASH Eating Plan DASH stands for "Dietary Approaches to Stop Hypertension." The DASH eating plan is a healthy eating plan that has been shown to reduce high blood pressure (hypertension). Additional health benefits may include reducing the risk of type 2 diabetes mellitus, heart disease, and stroke. The DASH eating plan may also help with weight loss. WHAT DO I NEED TO KNOW ABOUT THE DASH EATING PLAN? For the DASH eating plan, you will follow these general guidelines:  Choose foods with a percent daily value for sodium of less than 5% (as listed on the food label).  Use salt-free seasonings or herbs instead of table salt or sea salt.  Check with your health   care provider or pharmacist before using salt substitutes.  Eat lower-sodium products, often labeled as "lower sodium" or "no salt added."  Eat fresh foods.  Eat more vegetables, fruits, and low-fat dairy products.  Choose whole grains. Look for the word "whole" as the first word in the ingredient list.  Choose fish and skinless chicken or turkey more often than red meat. Limit fish,  poultry, and meat to 6 oz (170 g) each day.  Limit sweets, desserts, sugars, and sugary drinks.  Choose heart-healthy fats.  Limit cheese to 1 oz (28 g) per day.  Eat more home-cooked food and less restaurant, buffet, and fast food.  Limit fried foods.  Cook foods using methods other than frying.  Limit canned vegetables. If you do use them, rinse them well to decrease the sodium.  When eating at a restaurant, ask that your food be prepared with less salt, or no salt if possible. WHAT FOODS CAN I EAT? Seek help from a dietitian for individual calorie needs. Grains Whole grain or whole wheat bread. Brown rice. Whole grain or whole wheat pasta. Quinoa, bulgur, and whole grain cereals. Low-sodium cereals. Corn or whole wheat flour tortillas. Whole grain cornbread. Whole grain crackers. Low-sodium crackers. Vegetables Fresh or frozen vegetables (raw, steamed, roasted, or grilled). Low-sodium or reduced-sodium tomato and vegetable juices. Low-sodium or reduced-sodium tomato sauce and paste. Low-sodium or reduced-sodium canned vegetables.  Fruits All fresh, canned (in natural juice), or frozen fruits. Meat and Other Protein Products Ground beef (85% or leaner), grass-fed beef, or beef trimmed of fat. Skinless chicken or turkey. Ground chicken or turkey. Pork trimmed of fat. All fish and seafood. Eggs. Dried beans, peas, or lentils. Unsalted nuts and seeds. Unsalted canned beans. Dairy Low-fat dairy products, such as skim or 1% milk, 2% or reduced-fat cheeses, low-fat ricotta or cottage cheese, or plain low-fat yogurt. Low-sodium or reduced-sodium cheeses. Fats and Oils Tub margarines without trans fats. Light or reduced-fat mayonnaise and salad dressings (reduced sodium). Avocado. Safflower, olive, or canola oils. Natural peanut or almond butter. Other Unsalted popcorn and pretzels. The items listed above may not be a complete list of recommended foods or beverages. Contact your dietitian  for more options. WHAT FOODS ARE NOT RECOMMENDED? Grains White bread. White pasta. White rice. Refined cornbread. Bagels and croissants. Crackers that contain trans fat. Vegetables Creamed or fried vegetables. Vegetables in a cheese sauce. Regular canned vegetables. Regular canned tomato sauce and paste. Regular tomato and vegetable juices. Fruits Dried fruits. Canned fruit in light or heavy syrup. Fruit juice. Meat and Other Protein Products Fatty cuts of meat. Ribs, chicken wings, bacon, sausage, bologna, salami, chitterlings, fatback, hot dogs, bratwurst, and packaged luncheon meats. Salted nuts and seeds. Canned beans with salt. Dairy Whole or 2% milk, cream, half-and-half, and cream cheese. Whole-fat or sweetened yogurt. Full-fat cheeses or blue cheese. Nondairy creamers and whipped toppings. Processed cheese, cheese spreads, or cheese curds. Condiments Onion and garlic salt, seasoned salt, table salt, and sea salt. Canned and packaged gravies. Worcestershire sauce. Tartar sauce. Barbecue sauce. Teriyaki sauce. Soy sauce, including reduced sodium. Steak sauce. Fish sauce. Oyster sauce. Cocktail sauce. Horseradish. Ketchup and mustard. Meat flavorings and tenderizers. Bouillon cubes. Hot sauce. Tabasco sauce. Marinades. Taco seasonings. Relishes. Fats and Oils Butter, stick margarine, lard, shortening, ghee, and bacon fat. Coconut, palm kernel, or palm oils. Regular salad dressings. Other Pickles and olives. Salted popcorn and pretzels. The items listed above may not be a complete list of foods and beverages to avoid.   Contact your dietitian for more information. WHERE CAN I FIND MORE INFORMATION? National Heart, Lung, and Blood Institute: www.nhlbi.nih.gov/health/health-topics/topics/dash/ Document Released: 12/08/2010 Document Revised: 05/05/2013 Document Reviewed: 10/23/2012 ExitCare Patient Information 2015 [employer]. This information is not intended to replace advice given to you  by your health care provider. Make sure you discuss any questions you have with your health care provider.  

## 2014-05-02 ENCOUNTER — Telehealth: Payer: Self-pay | Admitting: Family Medicine

## 2014-05-02 NOTE — Telephone Encounter (Signed)
lmom to reschedule his appt with another provider he had a ov with Mcpherson but she will be retired at that time

## 2014-06-24 ENCOUNTER — Ambulatory Visit: Payer: BC Managed Care – PPO | Admitting: Family Medicine

## 2015-02-17 IMAGING — US US ABDOMEN LIMITED
1 series · 14 of 25 positions shown · non-contrast
Comparison: None.

CLINICAL DATA: Elevated bilirubin.

EXAM:
US ABDOMEN LIMITED - RIGHT UPPER QUADRANT

[Series 1: us abdomen limited · 0.30mm/px · 14 of 31 slices shown]
[im 1/31]
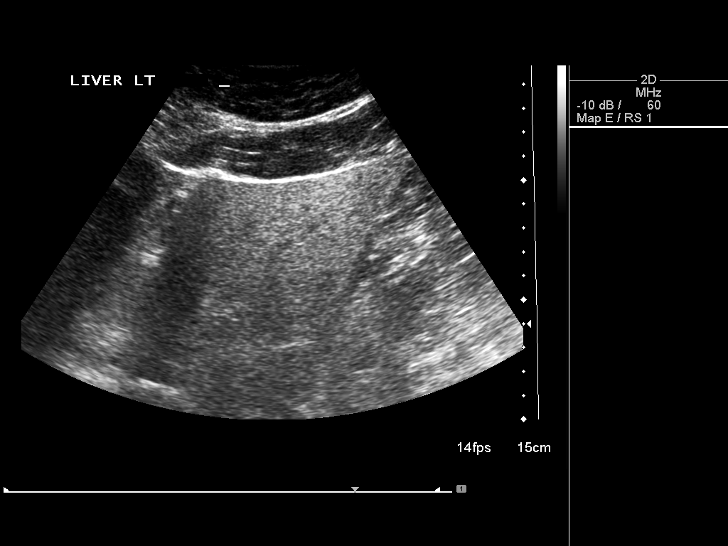
[im 3/31]
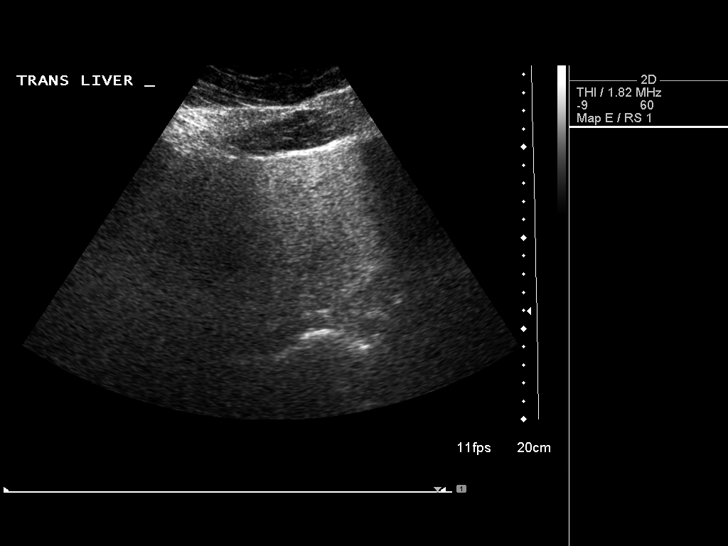
[im 6/31]
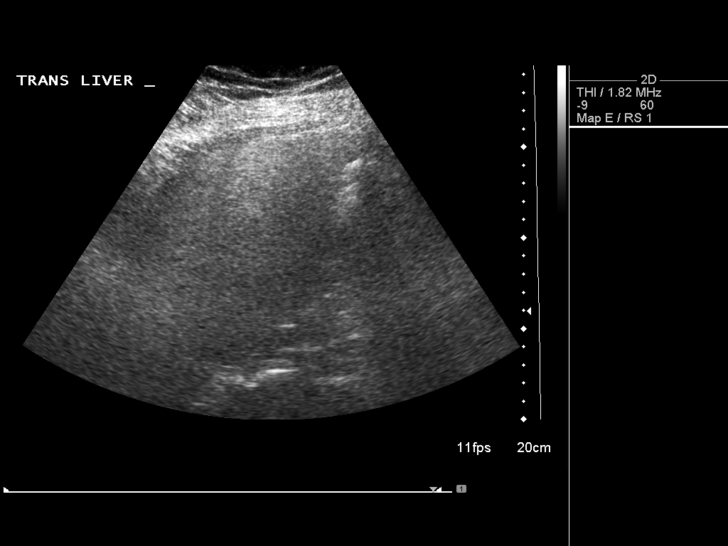
[im 8/31]
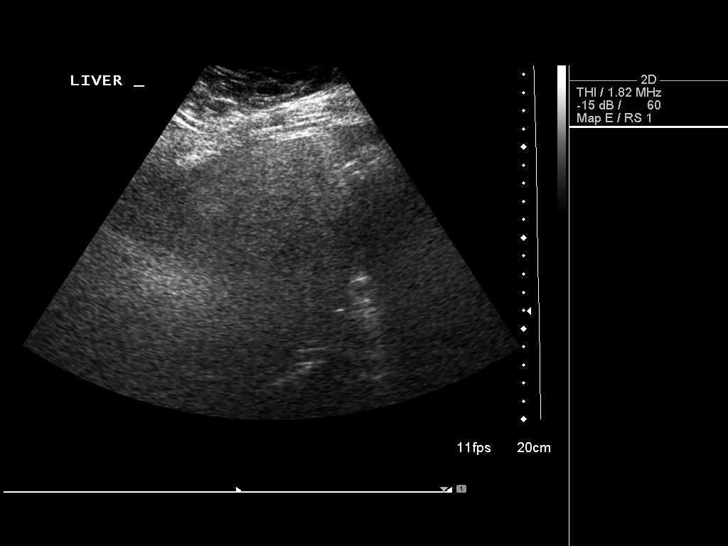
[im 11/31]
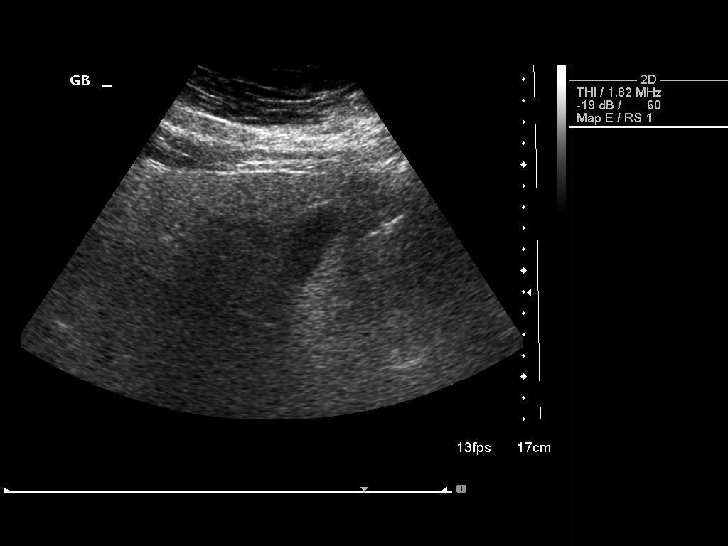
[im 12/31]
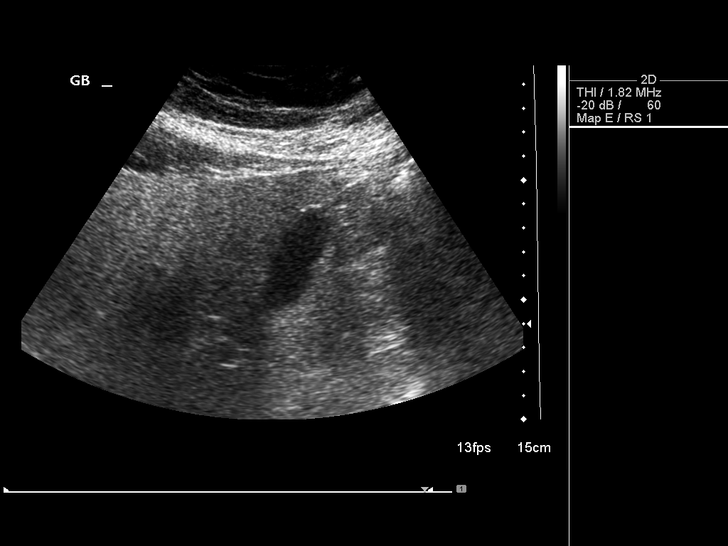
[im 14/31]
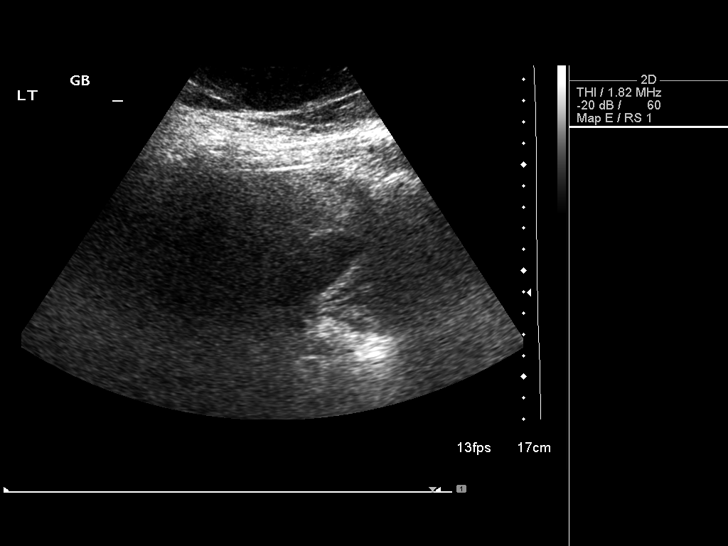
[im 17/31]
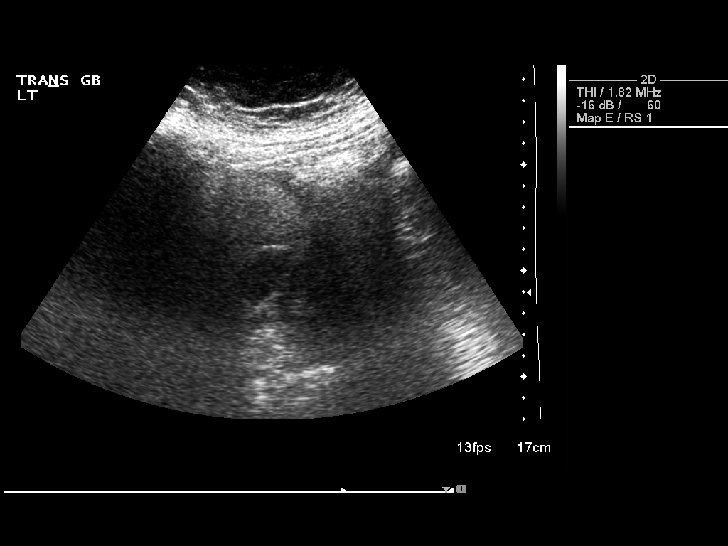
[im 19/31]
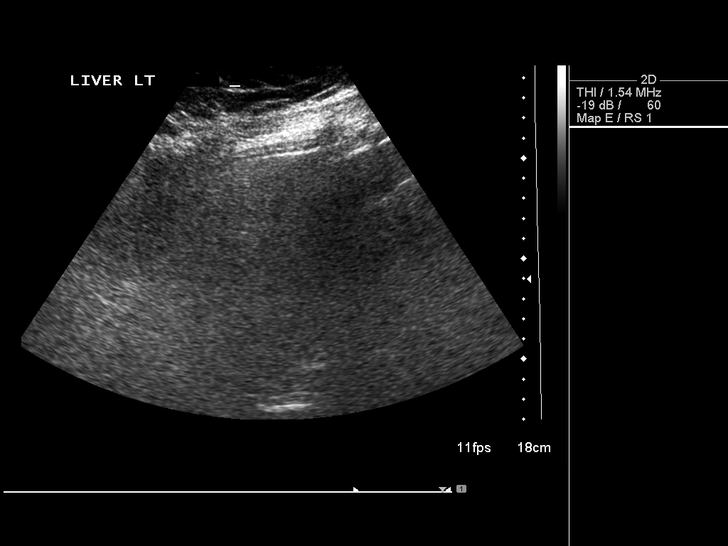
[im 21/31]
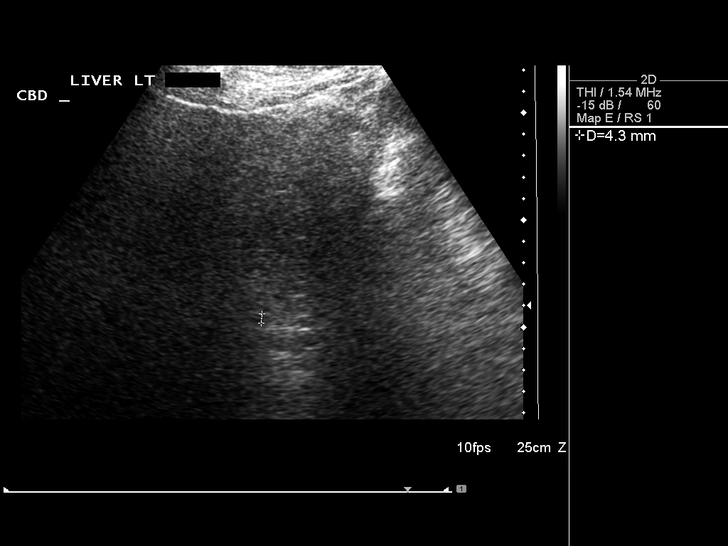
[im 23/31]
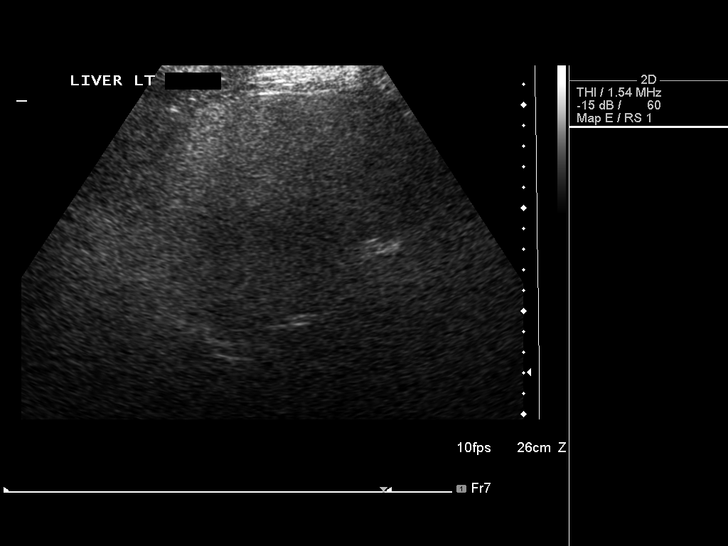
[im 26/31]
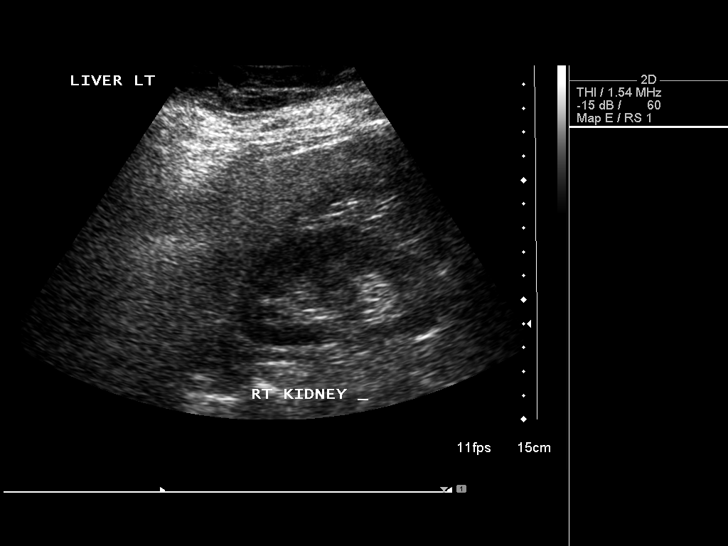
[im 28/31]
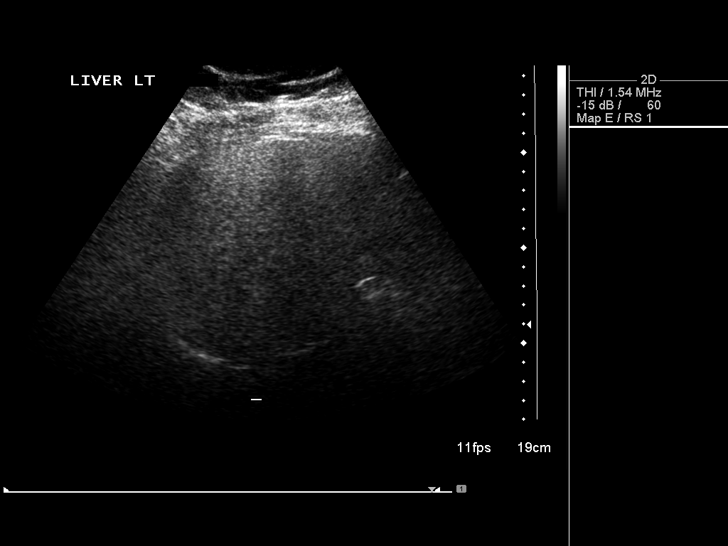
[im 31/31]
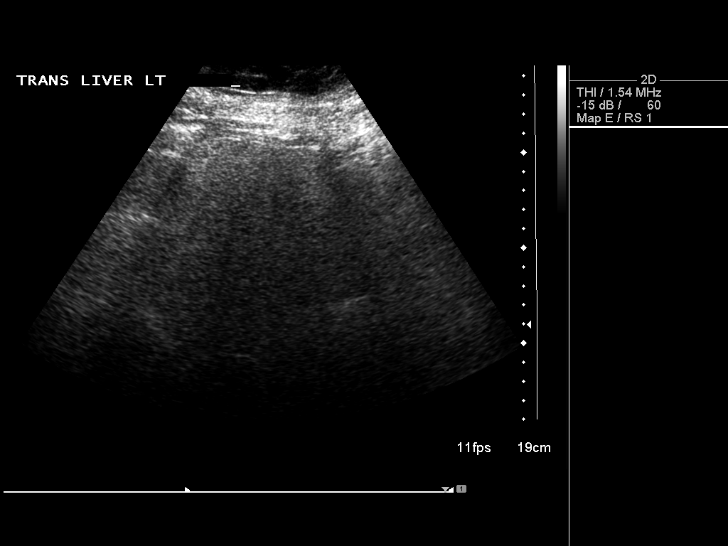

[14 of 25 positions shown; findings below may reference images not displayed]

FINDINGS: Gallbladder:

No gallstones or wall thickening visualized. No sonographic Murphy
sign noted.

Common bile duct:

Diameter: 4.3 mm

Liver:

No focal lesion identified.  Increased parenchymal echogenicity.
IMPRESSION: 1. Echogenic liver compatible with hepatic steatosis.
2. No evidence for biliary dilatation.

## 2015-04-21 ENCOUNTER — Other Ambulatory Visit: Payer: Self-pay | Admitting: Physician Assistant

## 2015-06-01 ENCOUNTER — Ambulatory Visit (INDEPENDENT_AMBULATORY_CARE_PROVIDER_SITE_OTHER): Payer: BLUE CROSS/BLUE SHIELD | Admitting: Physician Assistant

## 2015-06-01 VITALS — BP 126/80 | HR 83 | Temp 98.0°F | Resp 16 | Ht 70.0 in | Wt 253.8 lb

## 2015-06-01 DIAGNOSIS — I10 Essential (primary) hypertension: Secondary | ICD-10-CM | POA: Diagnosis not present

## 2015-06-01 DIAGNOSIS — E785 Hyperlipidemia, unspecified: Secondary | ICD-10-CM

## 2015-06-01 LAB — COMPLETE METABOLIC PANEL WITH GFR
ALK PHOS: 50 U/L (ref 40–115)
ALT: 25 U/L (ref 9–46)
AST: 21 U/L (ref 10–40)
Albumin: 4.3 g/dL (ref 3.6–5.1)
BUN: 16 mg/dL (ref 7–25)
CO2: 23 mmol/L (ref 20–31)
Calcium: 9.1 mg/dL (ref 8.6–10.3)
Chloride: 106 mmol/L (ref 98–110)
Creat: 0.86 mg/dL (ref 0.60–1.35)
GFR, Est African American: >89 mL/min (ref >=60)
GLUCOSE: 81 mg/dL (ref 65–99)
POTASSIUM: 4 mmol/L (ref 3.5–5.3)
SODIUM: 140 mmol/L (ref 135–146)
Total Bilirubin: 1.6 mg/dL — ABNORMAL HIGH (ref 0.2–1.2)
Total Protein: 6.6 g/dL (ref 6.1–8.1)

## 2015-06-01 LAB — LIPID PANEL
CHOL/HDL RATIO: 7.2 ratio — AB (ref <=5.0)
Cholesterol: 272 mg/dL — ABNORMAL HIGH (ref 125–200)
HDL: 38 mg/dL — AB (ref >=40)
LDL CALC: 171 mg/dL — AB (ref <130)
Triglycerides: 316 mg/dL — ABNORMAL HIGH (ref <150)
VLDL: 63 mg/dL — ABNORMAL HIGH (ref <30)

## 2015-06-01 MED ORDER — LISINOPRIL 10 MG PO TABS
10.0000 mg | ORAL_TABLET | Freq: Every day | ORAL | Status: DC
Start: 2015-06-01 — End: 2015-12-22

## 2015-06-01 NOTE — Progress Notes (Signed)
   Kurt Rodgers  MRN: [id number] DOB: 07/17/1965  Subjective:  Pt presents to clinic for medication refill.  He tolerates the medication ok.  He has been out of the cholesterol medication for about 3 weeks.  He checks his BP at home 120-140/80-90  Patient Active Problem List   Diagnosis Date Noted  . Obesity 12/24/2013  . HTN (hypertension) 01/06/2012  . High cholesterol 01/06/2012    Current Outpatient Prescriptions on File Prior to Visit  Medication Sig Dispense Refill  . aspirin EC 81 MG tablet Take 81 mg by mouth daily.    . cetirizine (ZYRTEC) 10 MG tablet Take 10 mg by mouth daily.    Marland Kitchen. ibuprofen (ADVIL,MOTRIN) 200 MG tablet Take 400 mg by mouth every 6 (six) hours as needed. Pain    . Multiple Vitamin (MULITIVITAMIN WITH MINERALS) TABS Take 1 tablet by mouth daily.    . rosuvastatin (CRESTOR) 10 MG tablet Take 1 tablet (10 mg total) by mouth daily. 90 tablet 3  . Triamcinolone Acetonide (TRIAMCINOLONE 0.1 % CREAM : EUCERIN) CREA Apply 1 application topically 2 (two) times daily. 454 each 1  . vitamin C (ASCORBIC ACID) 500 MG tablet Take 500 mg by mouth daily.     No current facility-administered medications on file prior to visit.    Allergies  Allergen Reactions  . Codeine Hives    Review of Systems  Constitutional: Negative for chills.  Respiratory: Negative for cough and shortness of breath.   Cardiovascular: Negative for chest pain, palpitations and leg swelling.   Objective:  BP 126/80 mmHg  Pulse 83  Temp(Src) 98 F (36.7 C) (Oral)  Resp 16  Ht 5\' 10"  (1.778 m)  Wt 253 lb 12.8 oz (115.123 kg)  BMI 36.42 kg/m2  SpO2 96%  Physical Exam  Constitutional: He is oriented to person, place, and time and well-developed, well-nourished, and in no distress.  HENT:  Head: Normocephalic and atraumatic.  Right Ear: External ear normal.  Left Ear: External ear normal.  Eyes: Conjunctivae are normal.  Neck: Normal range of motion.  Cardiovascular: Normal rate,  regular rhythm, normal heart sounds and intact distal pulses.   Pulmonary/Chest: Effort normal and breath sounds normal. He has no wheezes.  Musculoskeletal:       Right lower leg: He exhibits no edema.       Left lower leg: He exhibits no edema.  Neurological: He is alert and oriented to person, place, and time. Gait normal.  Skin: Skin is warm and dry.  Psychiatric: Mood, memory, affect and judgment normal.    Assessment and Plan :  Essential hypertension - Plan: COMPLETE METABOLIC PANEL WITH GFR, lisinopril (PRINIVIL,ZESTRIL) 10 MG tablet, Care order/instruction  Hyperlipidemia - Plan: Lipid panel   Continue medication for HTN as he has been as he had good control of his BP.  We will wait and fill his Crestor depending on his labs.  Benny LennertSarah Weber PA-C  [facility] 06/01/2015 11:59 AM

## 2015-06-01 NOTE — Patient Instructions (Addendum)
  Once we get your labs back I will send in the correct dose of your Crestor   IF you received an x-ray today, you will receive an invoice from [facility]. Please contact [facility] at [phone] with questions or concerns regarding your invoice.   IF you received labwork today, you will receive an invoice from [facility]. Please contact [facility] at [phone] with questions or concerns regarding your invoice.   Our billing staff will not be able to assist you with questions regarding bills from these companies.  You will be contacted with the lab results as soon as they are available. The fastest way to get your results is to activate your My Chart account. Instructions are located on the last page of this paperwork. If you have not heard from us regarding the results in 2 weeks, please contact this office.

## 2015-06-02 MED ORDER — ROSUVASTATIN CALCIUM 20 MG PO TABS: 10.0000 mg | ORAL_TABLET | Freq: Every day | ORAL | Status: DC

## 2015-06-02 NOTE — Addendum Note (Signed)
Addended by: Morrell RiddleWEBER, SARAH L on: 06/02/2015 04:23 PM   Modules accepted: Orders

## 2015-06-23 ENCOUNTER — Encounter: Payer: Self-pay | Admitting: *Deleted

## 2015-11-23 ENCOUNTER — Other Ambulatory Visit: Payer: Self-pay | Admitting: Physician Assistant

## 2015-11-23 ENCOUNTER — Other Ambulatory Visit: Payer: Self-pay

## 2015-11-23 DIAGNOSIS — E785 Hyperlipidemia, unspecified: Secondary | ICD-10-CM

## 2015-11-23 NOTE — Telephone Encounter (Signed)
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  [facility].

## 2015-11-27 NOTE — Telephone Encounter (Signed)
See next refill sequest note

## 2015-11-27 NOTE — Telephone Encounter (Signed)
05/2015 last ov Pt. Advised one month given and needs ov.

## 2015-12-22 ENCOUNTER — Encounter: Payer: Self-pay | Admitting: Physician Assistant

## 2015-12-22 ENCOUNTER — Ambulatory Visit (INDEPENDENT_AMBULATORY_CARE_PROVIDER_SITE_OTHER): Payer: BLUE CROSS/BLUE SHIELD | Admitting: Physician Assistant

## 2015-12-22 VITALS — BP 126/80 | HR 101 | Temp 98.7°F | Resp 16 | Ht 70.0 in | Wt 256.0 lb

## 2015-12-22 DIAGNOSIS — E7800 Pure hypercholesterolemia, unspecified: Secondary | ICD-10-CM | POA: Diagnosis not present

## 2015-12-22 DIAGNOSIS — I10 Essential (primary) hypertension: Secondary | ICD-10-CM

## 2015-12-22 MED ORDER — LISINOPRIL 10 MG PO TABS: 10.0000 mg | ORAL_TABLET | Freq: Every day | ORAL | 1 refills | Status: AC

## 2015-12-22 NOTE — Progress Notes (Signed)
Kurt Rodgers  MRN: [id number] DOB: [birth year]  Subjective:  Pt presents to clinic for medication refill.  He feels good and is having no problems.  He is concerned about his cholesterol medication and the fact that when it went generic his cholesterol numbers seemed to increase - he takes his medication eery day.  He has been on lipitor in the past and brand name controlled his cholesterol but when it went generic his cholesterol numbers increased so he was put on brand crestor and he had a good result with controlled cholesterol then he got generic crestor and the cholesterol control was gone.  He has been on generic for at least the last 8 months - since the last labs his dose was increased to 68m.  Exercise - very little School teacher  Eating - eat out a lot -  Review of Systems  Respiratory: Negative for cough and shortness of breath.   Cardiovascular: Negative for chest pain, palpitations and leg swelling.    Patient Active Problem List   Diagnosis Date Noted  . Obesity 12/24/2013  . HTN (hypertension) 01/06/2012  . High cholesterol 01/06/2012    Current Outpatient Prescriptions on File Prior to Visit  Medication Sig Dispense Refill  . aspirin EC 81 MG tablet Take 81 mg by mouth daily.    .Marland Kitchenibuprofen (ADVIL,MOTRIN) 200 MG tablet Take 400 mg by mouth every 6 (six) hours as needed. Pain    . Multiple Vitamin (MULITIVITAMIN WITH MINERALS) TABS Take 1 tablet by mouth daily.    . rosuvastatin (CRESTOR) 20 MG tablet TAKE 1/2 TABLET BY MOUTH DAILY 30 tablet 0  . Triamcinolone Acetonide (TRIAMCINOLONE 0.1 % CREAM : EUCERIN) CREA Apply 1 application topically 2 (two) times daily. 454 each 1  . vitamin C (ASCORBIC ACID) 500 MG tablet Take 500 mg by mouth daily.    . cetirizine (ZYRTEC) 10 MG tablet Take 10 mg by mouth daily.     No current facility-administered medications on file prior to visit.     Allergies  Allergen Reactions  . Codeine Hives    Pt patients past,  family and social history were reviewed and updated.   Objective:  BP 126/80   Pulse (!) 101   Temp 98.7 F (37.1 C) (Oral)   Resp 16   Ht _0  (1.778 m)   Wt 256 lb (116.1 kg)   SpO2 96%   BMI 36.73 kg/m   Physical Exam  Constitutional: He is oriented to person, place, and time and well-developed, well-nourished, and in no distress.  HENT:  Head: Normocephalic and atraumatic.  Right Ear: External ear normal.  Left Ear: External ear normal.  Eyes: Conjunctivae are normal.  Neck: Normal range of motion.  Cardiovascular: Normal rate, regular rhythm, normal heart sounds and intact distal pulses.   Pulmonary/Chest: Effort normal and breath sounds normal. He has no wheezes.  Musculoskeletal:       Right lower leg: He exhibits no edema.       Left lower leg: He exhibits no edema.  Neurological: He is alert and oriented to person, place, and time. Gait normal.  Skin: Skin is warm and dry.  Psychiatric: Mood, memory, affect and judgment normal.    Assessment and Plan :  Essential hypertension - Plan: lisinopril (PRINIVIL,ZESTRIL) 10 MG tablet  High cholesterol - Plan: CMP14+EGFR, Lipid panel - will wait for labs to make a determination about his medication - depending on his results from an increase in dose -  we may need to petition the insurance company to pay for brand crestor.  Windell Hummingbird PA-C  [facility] 12/22/2015 10:42 AM

## 2015-12-22 NOTE — Patient Instructions (Addendum)
I will contact you with your lab results as soon as they are available.   If you have not heard from me in 2 weeks, please contact me.  The fastest way to get your results is to register for My Chart (see the instructions on the last page of this printout).   IF you received an x-ray today, you will receive an invoice from [facility]. Please contact  Radiology at [phone] with questions or concerns regarding your invoice.   IF you received labwork today, you will receive an invoice from [facility]. Please contact [facility] at [phone] with questions or concerns regarding your invoice.   Our billing staff will not be able to assist you with questions regarding bills from these companies.  You will be contacted with the lab results as soon as they are available. The fastest way to get your results is to activate your My Chart account. Instructions are located on the last page of this paperwork. If you have not heard from us regarding the results in 2 weeks, please contact this office.      

## 2015-12-23 LAB — CMP14+EGFR
ALBUMIN: 4.5 g/dL (ref 3.5–5.5)
ALK PHOS: 58 IU/L (ref 39–117)
ALT: 31 IU/L (ref 0–44)
AST: 20 IU/L (ref 0–40)
Albumin/Globulin Ratio: 2.3 — ABNORMAL HIGH (ref 1.2–2.2)
BUN / CREAT RATIO: 16 (ref 9–20)
BUN: 16 mg/dL (ref 6–24)
Bilirubin Total: 1.7 mg/dL — ABNORMAL HIGH (ref 0.0–1.2)
CO2: 22 mmol/L (ref 18–29)
CREATININE: 0.97 mg/dL (ref 0.76–1.27)
Calcium: 9.4 mg/dL (ref 8.7–10.2)
Chloride: 103 mmol/L (ref 96–106)
GFR, EST AFRICAN AMERICAN: 106 mL/min/{1.73_m2} (ref >59)
GFR, EST NON AFRICAN AMERICAN: 92 mL/min/{1.73_m2} (ref >59)
GLOBULIN, TOTAL: 2 g/dL (ref 1.5–4.5)
Glucose: 88 mg/dL (ref 65–99)
Potassium: 4.3 mmol/L (ref 3.5–5.2)
SODIUM: 141 mmol/L (ref 134–144)
Total Protein: 6.5 g/dL (ref 6.0–8.5)

## 2015-12-23 LAB — LIPID PANEL
CHOL/HDL RATIO: 3.7 ratio (ref 0.0–5.0)
Cholesterol, Total: 156 mg/dL (ref 100–199)
HDL: 42 mg/dL (ref >39)
LDL CALC: 79 mg/dL (ref 0–99)
Triglycerides: 173 mg/dL — ABNORMAL HIGH (ref 0–149)
VLDL Cholesterol Cal: 35 mg/dL (ref 5–40)

## 2015-12-29 MED ORDER — ROSUVASTATIN CALCIUM 20 MG PO TABS: 20.0000 mg | ORAL_TABLET | Freq: Every day | ORAL | 1 refills | Status: AC

## 2015-12-29 NOTE — Addendum Note (Signed)
Addended by: Morrell RiddleWEBER, SARAH L on: 12/29/2015 07:01 AM   Modules accepted: Orders

## 2018-04-29 ENCOUNTER — Other Ambulatory Visit: Payer: Self-pay

## 2018-04-29 ENCOUNTER — Ambulatory Visit: Payer: BLUE CROSS/BLUE SHIELD | Admitting: Pediatrics

## 2018-04-29 ENCOUNTER — Encounter: Payer: Self-pay | Admitting: Pediatrics

## 2018-04-29 VITALS — BP 118/88 | HR 92 | Temp 98.2°F | Resp 18 | Ht 69.6 in | Wt 253.2 lb

## 2018-04-29 DIAGNOSIS — L500 Allergic urticaria: Secondary | ICD-10-CM

## 2018-04-29 DIAGNOSIS — I10 Essential (primary) hypertension: Secondary | ICD-10-CM

## 2018-04-29 DIAGNOSIS — R5382 Chronic fatigue, unspecified: Secondary | ICD-10-CM | POA: Insufficient documentation

## 2018-04-29 DIAGNOSIS — E669 Obesity, unspecified: Secondary | ICD-10-CM

## 2018-04-29 DIAGNOSIS — J301 Allergic rhinitis due to pollen: Secondary | ICD-10-CM

## 2018-04-29 DIAGNOSIS — Z872 Personal history of diseases of the skin and subcutaneous tissue: Secondary | ICD-10-CM

## 2018-04-29 DIAGNOSIS — L501 Idiopathic urticaria: Secondary | ICD-10-CM

## 2018-04-29 HISTORY — DX: Idiopathic urticaria: L50.1

## 2018-04-29 MED ORDER — TRIAMCINOLONE ACETONIDE 0.1 % EX CREA: TOPICAL_CREAM | CUTANEOUS | 3 refills | Status: AC

## 2018-04-29 MED ORDER — FAMOTIDINE 20 MG PO TABS: ORAL_TABLET | ORAL | 5 refills | Status: AC

## 2018-04-29 NOTE — Patient Instructions (Addendum)
Environmental control of dust mite Zyrtec 10 mg in the morning and hydroxyzine 25 mg at night Famotidine 20 mg-take 1 tablet twice a day.  It may help hives Montelukast 10 mg-take 1 tablet once a day-it may help hives Do foods with salicylates make you itch? Triamcinolone 0.1% cream twice a day if needed to red itchy areas below the face Continue on your other medications Call us if you are not doing well on this treatment plan We will call you with the results of your lab work We gave you information regarding the use of Xolair for chronic urticaria The minimally positive food allergy blood tests  to sesame, peanut, wheat and shrimp are clinically insignificant.  He had negative skin test to these and has never had allergic reactions to these in the past

## 2018-04-29 NOTE — Progress Notes (Addendum)
[address] [place]  [id number] Dept: [phone]  New Patient Note  Patient ID: Kurt Rodgers, male    DOB: 10/08/1965  Age: 53 y.o. MRN: [id number] Date of Office Visit: 04/29/2018 Referring provider: Wallis BambergMani, Mario, PA-C [address] [place], [place] [address]    Chief Complaint: Pruritis (rash and itching since september pt feels they are not connected )  HPI Kurt Rodgers presents for evaluation of an itchy rash since September 2019.  He has been on prednisone twice since then Prednisone did help his symptoms.  He has not had swelling of his lips, swelling of his throat or cardiorespiratory symptoms.  when he has had hives.  He has a history of eczema since 2011.  There are no clear-cut precipitants to his hives but his hives are worse at night.  Except for an allergy to codeine he has never had hives prior to September 2019.  He has not had tick bites  He has had seasonal allergic rhinitis since childhood.  He has aggravation of his symptoms on exposure to dust, cigarette smoke and the spring and fall of the year.  He has been on Zyrtec 10 mg once a day.  With the addition of montelukast and hydroxyzine 25 mg once a day in  the past couple of weeks he has noted some improvement but  Incomplete .  Has had hives on a daily basis.  He had a serum allergy screen which showed slight reactivity to peanut, wheat, sesame and shrimp.  He had positive IgE to dust mite ,  grass pollens, weeds, and  tree pollens.  He has never had asthma.  He has not had tick bites He has been treated for possible scabies with permethrin without much relief.  He had a negative serum ANA, negative screen for Lyme's disease, CBC with differential was unremarkable, negative screen for [facility] , complete metabolic panel was normal except for a bilirubin total of 2.2 mg  per DL.  Liver enzymes were normal.  No direct or indirect bilirubin done  Review of Systems  Constitutional: Negative.    HENT:       Seasonal allergic rhinitis since childhood  Eyes:       Itchy eyes at times  Respiratory: Negative.   Cardiovascular:       Hypertension  Gastrointestinal:       Occasional heartburn  Genitourinary:       History of kidney stones  Musculoskeletal: Negative.   Skin:       Hives since September 2019.  History of eczema since 2001  Neurological: Negative.   Endo/Heme/Allergies:       No diabetes or thyroid disease  Psychiatric/Behavioral: Negative.     Outpatient Encounter Medications as of 04/29/2018  Medication Sig  . aspirin EC 81 MG tablet Take 81 mg by mouth daily.  . cetirizine (ZYRTEC) 10 MG tablet Take 10 mg by mouth daily.  . hydrOXYzine (ATARAX/VISTARIL) 25 MG tablet Take 25 mg by mouth 3 (three) times daily as needed.  Marland Kitchen. ibuprofen (ADVIL,MOTRIN) 200 MG tablet Take 400 mg by mouth every 6 (six) hours as needed. Pain  . lisinopril (PRINIVIL,ZESTRIL) 10 MG tablet Take 1 tablet (10 mg total) by mouth daily.  . montelukast (SINGULAIR) 10 MG tablet Take 10 mg by mouth at bedtime.  . Multiple Vitamin (MULITIVITAMIN WITH MINERALS) TABS Take 1 tablet by mouth daily.  . rosuvastatin (CRESTOR) 20 MG tablet Take 1 tablet (20 mg total) by mouth  daily.  . Triamcinolone Acetonide (TRIAMCINOLONE 0.1 % CREAM : EUCERIN) CREA Apply 1 application topically 2 (two) times daily.  . vitamin C (ASCORBIC ACID) 500 MG tablet Take 500 mg by mouth daily.  . famotidine (PEPCID) 20 MG tablet 1 tablet twice a day will help with hives  . triamcinolone cream (KENALOG) 0.1 % 1 application twice a day if needed to red itchy areas below the face   No facility-administered encounter medications on file as of 04/29/2018.      Drug Allergies:  Allergies  Allergen Reactions  . Codeine Hives    Family History: Jagger's family history includes Allergic rhinitis in his mother; Asthma in his mother; Cancer in his father; Hyperlipidemia in his father; Hypertension in his father; Stroke in his  father...  Family history is negative for sinus problems, angioedema, eczema, hives, food allergies, lupus, chronic bronchitis or emphysema.  Social and environmental.  There are no pets in the home.  He is not exposed to cigarette smoking.  He has not smoked cigarettes in the past.  He is a Education officer, museum  Physical Exam: BP 118/88   Pulse 92   Temp 98.2 F (36.8 C) (Oral)   Resp 18   Ht 5' 9.6" (1.768 m)   Wt 253 lb 3.2 oz (114.9 kg)   SpO2 95%   BMI 36.75 kg/m    Physical Exam Vitals signs reviewed.  Constitutional:      Appearance: Normal appearance. He is obese.  HENT:     Head:     Comments: Eyes normal.  Ears normal.  Nose mild swelling of the nasal turbinates.  Pharynx normal. Neck:     Musculoskeletal: Neck supple.     Comments: No thyromegaly Cardiovascular:     Rate and Rhythm: Normal rate and regular rhythm.     Comments: S1-S2 normal no murmurs Pulmonary:     Comments: Clear to percussion and auscultation Abdominal:     Palpations: Abdomen is soft.     Tenderness: There is no abdominal tenderness.     Comments: No hepatosplenomegaly  Lymphadenopathy:     Cervical: No cervical adenopathy.  Skin:    Comments: There were several urticarial lesions.  He had a hyperpigmented area in the left lower leg which had been present for about 10 years and diagnosed as eczema  Neurological:     General: No focal deficit present.     Mental Status: He is alert and oriented to person, place, and time.  Psychiatric:        Mood and Affect: Mood normal.        Behavior: Behavior normal.        Thought Content: Thought content normal.        Judgment: Judgment normal.     Diagnostics: Allergy skin test were positive to grass pollen, weed pollen, tree pollens, dust mite, dog, cockroach. .  Skin testing to foods was negative including shrimp, crab, sesame, peanut and wheat.    Assessment  Assessment and Plan: 1. Urticaria, idiopathic   2. Chronic fatigue   3.  Seasonal allergic rhinitis due to pollen   4. Allergic urticaria   5. Essential hypertension   6. Obesity (BMI 35.0-39.9 without comorbidity)   7. History of eczema     Meds ordered this encounter  Medications  . famotidine (PEPCID) 20 MG tablet    Sig: 1 tablet twice a day will help with hives    Dispense:  60 tablet  Refill:  5  . triamcinolone cream (KENALOG) 0.1 %    Sig: 1 application twice a day if needed to red itchy areas below the face    Dispense:  45 g    Refill:  3    Patient Instructions  Environmental control of dust mite Zyrtec 10 mg in the morning and hydroxyzine 25 mg at night Famotidine 20 mg-take 1 tablet twice a day.  It may help hives Montelukast 10 mg-take 1 tablet once a day-it may help hives Do foods with salicylates make you itch? Triamcinolone 0.1% cream twice a day if needed to red itchy areas below the face Continue on your other medications Call us if you are not doing well on this treatment plan We will call you with the results of your lab work We gave you information regarding the use of Xolair for chronic urticaria The minimally positive food allergy blood tests  to sesame, peanut, wheat and shrimp are clinically insignificant.  He had negative skin test to these and has never had allergic reactions to these in the past   Return in about 4 weeks (around 05/27/2018).   Thank you for the opportunity to care for this patient.  Please do not hesitate to contact me with questions.  Tonette Bihari, M.D.  [facility] [address] [place], [place] [address] [phone]

## 2018-05-03 LAB — ALPHA-GAL PANEL
Alpha Gal IgE*: <0.1 kU/L (ref <0.10)
Beef (Bos spp) IgE: <0.1 kU/L (ref <0.35)
Class Interpretation: 0
Class Interpretation: 0
Class Interpretation: 0
Lamb/Mutton (Ovis spp) IgE: <0.1 kU/L (ref <0.35)
Pork (Sus spp) IgE: <0.1 kU/L (ref <0.35)

## 2018-05-03 LAB — CBC
Hematocrit: 48.2 % (ref 37.5–51.0)
Hemoglobin: 16.7 g/dL (ref 13.0–17.7)
MCH: 30.6 pg (ref 26.6–33.0)
MCHC: 34.6 g/dL (ref 31.5–35.7)
MCV: 88 fL (ref 79–97)
Platelets: 208 10*3/uL (ref 150–450)
RBC: 5.46 x10E6/uL (ref 4.14–5.80)
RDW: 13.5 % (ref 11.6–15.4)
WBC: 6.5 10*3/uL (ref 3.4–10.8)

## 2018-05-03 LAB — COMPREHENSIVE METABOLIC PANEL
ALT: 38 IU/L (ref 0–44)
AST: 31 IU/L (ref 0–40)
Albumin/Globulin Ratio: 2.6 — ABNORMAL HIGH (ref 1.2–2.2)
Albumin: 4.6 g/dL (ref 4.0–5.0)
Alkaline Phosphatase: 62 IU/L (ref 39–117)
BUN/Creatinine Ratio: 11 (ref 9–20)
BUN: 11 mg/dL (ref 6–24)
Bilirubin Total: 2.2 mg/dL — ABNORMAL HIGH (ref 0.0–1.2)
CO2: 23 mmol/L (ref 20–29)
Calcium: 9.8 mg/dL (ref 8.7–10.2)
Chloride: 101 mmol/L (ref 96–106)
Creatinine, Ser: 0.98 mg/dL (ref 0.76–1.27)
GFR calc Af Amer: 103 mL/min/{1.73_m2} (ref >59)
GFR calc non Af Amer: 90 mL/min/{1.73_m2} (ref >59)
Globulin, Total: 1.8 g/dL (ref 1.5–4.5)
Glucose: 81 mg/dL (ref 65–99)
Potassium: 4.3 mmol/L (ref 3.5–5.2)
Sodium: 142 mmol/L (ref 134–144)
Total Protein: 6.4 g/dL (ref 6.0–8.5)

## 2018-05-03 LAB — ANA: Anti Nuclear Antibody (ANA): NEGATIVE

## 2018-05-03 LAB — TRYPTASE: Tryptase: 6.4 ug/L (ref 2.2–13.2)

## 2018-05-03 LAB — BILIRUBIN, FRACTIONATED(TOT/DIR/INDIR)
Bilirubin Total: 1.6 mg/dL — ABNORMAL HIGH (ref 0.0–1.2)
Bilirubin, Direct: 0.31 mg/dL (ref 0.00–0.40)
Bilirubin, Indirect: 1.29 mg/dL — ABNORMAL HIGH (ref 0.10–0.80)

## 2018-05-03 LAB — TSH: TSH: 1.03 u[IU]/mL (ref 0.450–4.500)

## 2018-05-03 LAB — THYROID ANTIBODIES
Thyroglobulin Antibody: <1 IU/mL (ref 0.0–0.9)
Thyroperoxidase Ab SerPl-aCnc: 16 IU/mL (ref 0–34)

## 2018-05-03 LAB — SPECIMEN STATUS REPORT

## 2018-05-03 LAB — T4, FREE: Free T4: 1.53 ng/dL (ref 0.82–1.77)

## 2018-06-04 ENCOUNTER — Encounter: Payer: Self-pay | Admitting: Pediatrics

## 2018-06-04 ENCOUNTER — Other Ambulatory Visit: Payer: Self-pay

## 2018-06-04 ENCOUNTER — Ambulatory Visit (INDEPENDENT_AMBULATORY_CARE_PROVIDER_SITE_OTHER): Payer: BC Managed Care – PPO | Admitting: Pediatrics

## 2018-06-04 DIAGNOSIS — J301 Allergic rhinitis due to pollen: Secondary | ICD-10-CM

## 2018-06-04 DIAGNOSIS — I10 Essential (primary) hypertension: Secondary | ICD-10-CM | POA: Diagnosis not present

## 2018-06-04 DIAGNOSIS — L508 Other urticaria: Secondary | ICD-10-CM | POA: Insufficient documentation

## 2018-06-04 MED ORDER — HYDROXYZINE HCL 25 MG PO TABS: 25.0000 mg | ORAL_TABLET | Freq: Three times a day (TID) | ORAL | 5 refills | Status: AC | PRN

## 2018-06-04 NOTE — Progress Notes (Signed)
RE: Kurt ShearsRichard Rodgers MRN: [id number] DOB: 05/06/1965 Date of Telemedicine Visit: 06/04/2018  Referring provider: Wallis BambergMani, Mario, PA-C Primary care provider: Wallis BambergMani, Mario, PA-C  Chief Complaint: Urticaria   Telemedicine Follow Up Visit via Telephone: I connected with Kurt Rodgers for a follow up on 06/04/18 by telephone and verified that I am speaking with the correct person using two identifiers.   I discussed the limitations, risks, security and privacy concerns of performing an evaluation and management service by telephone and the availability of in person appointments. I also discussed with the patient that there may be a patient responsible charge related to this service. The patient expressed understanding and agreed to proceed.  Patient is at home accompanied by himself who provided/contributed to the history.  Provider is at the office.  Visit start time: 11:38 Visit end time: 12:00 pm Insurance consent/check in by: Desma PaganiniJanea Medical consent and medical assistant/nurse: Damita  History of Present Illness: He is a 53 y.o. male, who is being followed for chronic urticaria. His previous allergy office visit was on 04/29/2018 with Dr. Beaulah DinningBardelas.  Since his last visit, his hives have been better by using Zyrtec 10 mg in the morning and hydroxyzine 25 mg at night, famotidine 20 mg twice a day and montelukast 10 mg once a day.  However he does have hives on a daily basis..  About twice a week he has to add an additional dose of hydroxyzine to control the hives He would like to explore the use of Xolair because of his chronic urticaria.  He has been on prednisone twice in the past few months with good improvement while on it.  Foods with salicylates did not seem to make him worse.  He has not had to use triamcinolone 0.1% cream .  Lab work for chronic urticaria was unremarkable.  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  Meds ordered this encounter  Medications  . hydrOXYzine (ATARAX/VISTARIL) 25 MG tablet    Sig: Take 1 tablet (25 mg total) by mouth 3 (three) times daily as needed for itching.    Dispense:  100 tablet    Refill:  5    Will call when needed   Lab Orders  No laboratory test(s) ordered today    Diagnostics: None.  Medication List:  Current Outpatient Medications  Medication Sig Dispense Refill  . aspirin EC 81 MG tablet Take 81 mg by mouth daily.    . cetirizine (ZYRTEC) 10 MG tablet Take 10 mg by mouth daily.    . famotidine (PEPCID) 20 MG tablet 1 tablet twice a day will help with hives (Patient taking differently: Take 20 mg by mouth daily. 1 tablet twice a day will help with hives) 60 tablet 5  . hydrOXYzine (ATARAX/VISTARIL) 25 MG tablet Take 1 tablet (25 mg total) by mouth 3 (three) times daily as needed for itching. 100 tablet 5  . lisinopril (PRINIVIL,ZESTRIL) 10 MG tablet Take 1 tablet (10 mg total) by mouth daily. 90 tablet 1  .  montelukast (SINGULAIR) 10 MG tablet Take 10 mg by mouth at bedtime.    . Multiple Vitamin (MULITIVITAMIN WITH MINERALS) TABS Take 1 tablet by mouth daily.    . rosuvastatin (CRESTOR) 20 MG tablet Take 1 tablet (20 mg total) by mouth daily. 90 tablet 1  . triamcinolone cream (KENALOG) 0.1 % 1 application twice a day if needed to red itchy areas below the face 45 g 3  . vitamin C (ASCORBIC ACID) 500 MG tablet Take 500 mg by mouth daily.    Marland Kitchen ibuprofen (ADVIL,MOTRIN) 200 MG tablet Take 400 mg by mouth every 6 (six) hours as needed. Pain     No current facility-administered medications for  this visit.    Allergies: Allergies  Allergen Reactions  . Codeine Hives   I reviewed his past medical history, social history, family history, and environmental history and no significant changes have been reported from previous visit on 04-29-2018  Review of Systems Objective: Physical Exam Not obtained as encounter was done via telephone.   Previous notes and tests were reviewed.  I discussed the assessment and treatment plan with the patient. The patient was provided an opportunity to ask questions and all were answered. The patient agreed with the plan and demonstrated an understanding of the instructions.   The patient was advised to call back or seek an in-person evaluation if the symptoms worsen or if the condition fails to improve as anticipated.  I provided 22 minutes of non-face-to-face time during this encounter.  It was my pleasure to participate in Kurt Rodgers's care today. Please feel free to contact me with any questions or concerns.   Sincerely,  Ginnie Smart, MD

## 2018-06-04 NOTE — Patient Instructions (Addendum)
Zyrtec 10 mg in the morning and hydroxyzine 25 mg at night Famotidine 20 mg twice a day Montelukast 10 mg once a day You may add hydroxyzine 25 mg twice a day if the hives are not well controlled We will go ahead and apply for the use of Xolair because of your chronic urticaria Continue on your other medications Call us if you are not doing well on this treatment plan

## 2018-06-07 ENCOUNTER — Telehealth: Payer: Self-pay | Admitting: *Deleted

## 2018-06-07 DIAGNOSIS — L501 Idiopathic urticaria: Secondary | ICD-10-CM

## 2018-06-07 NOTE — Telephone Encounter (Signed)
Per Dr Beaulah Dinning I reached out to patient to discuss starting Xolair for his hives.  I explained the process: approval and copay and patient has agreed her wants to try therapy.  I advised him we will move forward with approval and will buy and bill Rx to his Ins.  I will go ahead and sign him up for copay card.  Once Rx is approved I will be reaching out to patient to set appt to start.

## 2018-06-10 ENCOUNTER — Other Ambulatory Visit: Payer: Self-pay

## 2018-06-10 ENCOUNTER — Ambulatory Visit: Payer: BC Managed Care – PPO

## 2018-06-10 ENCOUNTER — Ambulatory Visit (INDEPENDENT_AMBULATORY_CARE_PROVIDER_SITE_OTHER): Payer: BC Managed Care – PPO

## 2018-06-10 DIAGNOSIS — L501 Idiopathic urticaria: Secondary | ICD-10-CM | POA: Diagnosis not present

## 2018-06-10 DIAGNOSIS — L508 Other urticaria: Secondary | ICD-10-CM

## 2018-06-10 MED ORDER — OMALIZUMAB 150 MG ~~LOC~~ SOLR
300.0000 mg | SUBCUTANEOUS | Status: AC
  Administered 2018-06-10 – 2019-01-21 (×9): 300 mg via SUBCUTANEOUS

## 2018-06-10 MED ORDER — EPINEPHRINE 0.3 MG/0.3ML IJ SOAJ: 0.3000 mg | Freq: Once | INTRAMUSCULAR | 2 refills | Status: AC

## 2018-06-10 NOTE — Progress Notes (Signed)
Immunotherapy   Patient Details  Name: Kurt Rodgers MRN: [id number] Date of Birth: 1965-03-07  06/10/2018  Kurt Rodgers started injections for  Xolair 300 mg Following schedule: 4 weeks   Epi-Pen:Prescription for Epi-Pen given to patient to [facility]  Consent signed and patient instructions given.Patient will remain in the office in a patient room for his 30 min wait. Patient waited for 30 mins without any c/o pain or difficulties to injection site.    Festus Holts Quadir Muns 06/10/2018, 2:21 PM

## 2018-07-05 DIAGNOSIS — L501 Idiopathic urticaria: Secondary | ICD-10-CM | POA: Diagnosis not present

## 2018-07-08 ENCOUNTER — Other Ambulatory Visit: Payer: Self-pay

## 2018-07-08 ENCOUNTER — Ambulatory Visit (INDEPENDENT_AMBULATORY_CARE_PROVIDER_SITE_OTHER): Payer: BC Managed Care – PPO

## 2018-07-08 DIAGNOSIS — L501 Idiopathic urticaria: Secondary | ICD-10-CM | POA: Diagnosis not present

## 2018-07-08 DIAGNOSIS — L508 Other urticaria: Secondary | ICD-10-CM

## 2018-08-02 DIAGNOSIS — L501 Idiopathic urticaria: Secondary | ICD-10-CM | POA: Diagnosis not present

## 2018-08-05 ENCOUNTER — Ambulatory Visit (INDEPENDENT_AMBULATORY_CARE_PROVIDER_SITE_OTHER): Payer: BC Managed Care – PPO | Admitting: *Deleted

## 2018-08-05 ENCOUNTER — Other Ambulatory Visit: Payer: Self-pay

## 2018-08-05 DIAGNOSIS — L508 Other urticaria: Secondary | ICD-10-CM

## 2018-08-05 DIAGNOSIS — L501 Idiopathic urticaria: Secondary | ICD-10-CM | POA: Diagnosis not present

## 2018-09-05 DIAGNOSIS — L501 Idiopathic urticaria: Secondary | ICD-10-CM | POA: Diagnosis not present

## 2018-09-06 ENCOUNTER — Ambulatory Visit (INDEPENDENT_AMBULATORY_CARE_PROVIDER_SITE_OTHER): Payer: BC Managed Care – PPO | Admitting: *Deleted

## 2018-09-06 ENCOUNTER — Other Ambulatory Visit: Payer: Self-pay

## 2018-09-06 DIAGNOSIS — L508 Other urticaria: Secondary | ICD-10-CM

## 2018-09-06 DIAGNOSIS — L501 Idiopathic urticaria: Secondary | ICD-10-CM | POA: Diagnosis not present

## 2018-10-02 DIAGNOSIS — L501 Idiopathic urticaria: Secondary | ICD-10-CM | POA: Diagnosis not present

## 2018-10-03 ENCOUNTER — Other Ambulatory Visit: Payer: Self-pay

## 2018-10-03 ENCOUNTER — Ambulatory Visit (INDEPENDENT_AMBULATORY_CARE_PROVIDER_SITE_OTHER): Payer: BC Managed Care – PPO | Admitting: *Deleted

## 2018-10-03 DIAGNOSIS — L501 Idiopathic urticaria: Secondary | ICD-10-CM | POA: Diagnosis not present

## 2018-10-03 DIAGNOSIS — L508 Other urticaria: Secondary | ICD-10-CM

## 2018-10-29 DIAGNOSIS — L501 Idiopathic urticaria: Secondary | ICD-10-CM | POA: Diagnosis not present

## 2018-10-30 ENCOUNTER — Other Ambulatory Visit: Payer: Self-pay

## 2018-10-30 ENCOUNTER — Ambulatory Visit (INDEPENDENT_AMBULATORY_CARE_PROVIDER_SITE_OTHER): Payer: BC Managed Care – PPO

## 2018-10-30 DIAGNOSIS — L501 Idiopathic urticaria: Secondary | ICD-10-CM

## 2018-10-30 DIAGNOSIS — L508 Other urticaria: Secondary | ICD-10-CM

## 2018-11-25 DIAGNOSIS — L501 Idiopathic urticaria: Secondary | ICD-10-CM | POA: Diagnosis not present

## 2018-11-26 ENCOUNTER — Other Ambulatory Visit: Payer: Self-pay

## 2018-11-26 ENCOUNTER — Ambulatory Visit (INDEPENDENT_AMBULATORY_CARE_PROVIDER_SITE_OTHER): Payer: BC Managed Care – PPO

## 2018-11-26 DIAGNOSIS — L508 Other urticaria: Secondary | ICD-10-CM

## 2018-11-26 DIAGNOSIS — L501 Idiopathic urticaria: Secondary | ICD-10-CM | POA: Diagnosis not present

## 2018-12-23 DIAGNOSIS — L501 Idiopathic urticaria: Secondary | ICD-10-CM

## 2018-12-24 ENCOUNTER — Ambulatory Visit (INDEPENDENT_AMBULATORY_CARE_PROVIDER_SITE_OTHER): Payer: BC Managed Care – PPO

## 2018-12-24 ENCOUNTER — Other Ambulatory Visit: Payer: Self-pay

## 2018-12-24 DIAGNOSIS — L501 Idiopathic urticaria: Secondary | ICD-10-CM | POA: Diagnosis not present

## 2018-12-24 DIAGNOSIS — L508 Other urticaria: Secondary | ICD-10-CM

## 2019-01-20 DIAGNOSIS — L501 Idiopathic urticaria: Secondary | ICD-10-CM | POA: Diagnosis not present

## 2019-01-21 ENCOUNTER — Ambulatory Visit (INDEPENDENT_AMBULATORY_CARE_PROVIDER_SITE_OTHER): Payer: BC Managed Care – PPO

## 2019-01-21 ENCOUNTER — Other Ambulatory Visit: Payer: Self-pay

## 2019-01-21 DIAGNOSIS — L501 Idiopathic urticaria: Secondary | ICD-10-CM | POA: Diagnosis not present

## 2019-01-21 DIAGNOSIS — L508 Other urticaria: Secondary | ICD-10-CM

## 2019-02-19 ENCOUNTER — Ambulatory Visit: Payer: Self-pay

## 2019-06-13 ENCOUNTER — Other Ambulatory Visit: Payer: Self-pay | Admitting: Pediatrics

## 2019-07-25 ENCOUNTER — Other Ambulatory Visit: Payer: Self-pay | Admitting: Pediatrics
